# Patient Record
Sex: Male | Born: 1955 | Race: Black or African American | Hispanic: No
Health system: Southern US, Community
[De-identification: ages and names within clinical notes are randomized; demographics above are authoritative.]

## PROBLEM LIST (undated history)

## (undated) DIAGNOSIS — G4730 Sleep apnea, unspecified: Secondary | ICD-10-CM

## (undated) DIAGNOSIS — I509 Heart failure, unspecified: Secondary | ICD-10-CM

## (undated) DIAGNOSIS — E119 Type 2 diabetes mellitus without complications: Secondary | ICD-10-CM

## (undated) DIAGNOSIS — N289 Disorder of kidney and ureter, unspecified: Secondary | ICD-10-CM

## (undated) DIAGNOSIS — J9612 Chronic respiratory failure with hypercapnia: Secondary | ICD-10-CM

## (undated) DIAGNOSIS — I10 Essential (primary) hypertension: Secondary | ICD-10-CM

## (undated) HISTORY — PX: TRACHEOSTOMY: SUR1362

---

## 2015-04-29 ENCOUNTER — Inpatient Hospital Stay (HOSPITAL_COMMUNITY)
Admission: EM | Admit: 2015-04-29 | Discharge: 2015-05-03 | DRG: 291 | Disposition: A | Discharge status: Other Acute Inpatient Hospital | Payer: Medicare Other | Attending: Internal Medicine | Admitting: Internal Medicine

## 2015-04-29 ENCOUNTER — Emergency Department (HOSPITAL_COMMUNITY): Payer: Medicare Other

## 2015-04-29 ENCOUNTER — Encounter (HOSPITAL_COMMUNITY): Payer: Self-pay | Admitting: Emergency Medicine

## 2015-04-29 ENCOUNTER — Inpatient Hospital Stay (HOSPITAL_COMMUNITY): Payer: Medicare Other

## 2015-04-29 DIAGNOSIS — Z7951 Long term (current) use of inhaled steroids: Secondary | ICD-10-CM

## 2015-04-29 DIAGNOSIS — D638 Anemia in other chronic diseases classified elsewhere: Secondary | ICD-10-CM | POA: Diagnosis present

## 2015-04-29 DIAGNOSIS — J9622 Acute and chronic respiratory failure with hypercapnia: Secondary | ICD-10-CM | POA: Diagnosis present

## 2015-04-29 DIAGNOSIS — R4182 Altered mental status, unspecified: Secondary | ICD-10-CM

## 2015-04-29 DIAGNOSIS — Z09 Encounter for follow-up examination after completed treatment for conditions other than malignant neoplasm: Secondary | ICD-10-CM

## 2015-04-29 DIAGNOSIS — I5033 Acute on chronic diastolic (congestive) heart failure: Secondary | ICD-10-CM | POA: Diagnosis present

## 2015-04-29 DIAGNOSIS — Z794 Long term (current) use of insulin: Secondary | ICD-10-CM | POA: Diagnosis not present

## 2015-04-29 DIAGNOSIS — R109 Unspecified abdominal pain: Secondary | ICD-10-CM | POA: Insufficient documentation

## 2015-04-29 DIAGNOSIS — E8770 Fluid overload, unspecified: Secondary | ICD-10-CM | POA: Diagnosis not present

## 2015-04-29 DIAGNOSIS — Z6841 Body Mass Index (BMI) 40.0 and over, adult: Secondary | ICD-10-CM

## 2015-04-29 DIAGNOSIS — D696 Thrombocytopenia, unspecified: Secondary | ICD-10-CM | POA: Diagnosis not present

## 2015-04-29 DIAGNOSIS — J9621 Acute and chronic respiratory failure with hypoxia: Secondary | ICD-10-CM | POA: Diagnosis present

## 2015-04-29 DIAGNOSIS — E872 Acidosis: Secondary | ICD-10-CM | POA: Diagnosis present

## 2015-04-29 DIAGNOSIS — Z930 Tracheostomy status: Secondary | ICD-10-CM | POA: Diagnosis not present

## 2015-04-29 DIAGNOSIS — Z885 Allergy status to narcotic agent status: Secondary | ICD-10-CM | POA: Diagnosis not present

## 2015-04-29 DIAGNOSIS — I1310 Hypertensive heart and chronic kidney disease without heart failure, with stage 1 through stage 4 chronic kidney disease, or unspecified chronic kidney disease: Secondary | ICD-10-CM | POA: Diagnosis present

## 2015-04-29 DIAGNOSIS — E1122 Type 2 diabetes mellitus with diabetic chronic kidney disease: Secondary | ICD-10-CM | POA: Diagnosis present

## 2015-04-29 DIAGNOSIS — E875 Hyperkalemia: Secondary | ICD-10-CM | POA: Diagnosis present

## 2015-04-29 DIAGNOSIS — Z79899 Other long term (current) drug therapy: Secondary | ICD-10-CM | POA: Diagnosis not present

## 2015-04-29 DIAGNOSIS — N183 Chronic kidney disease, stage 3 (moderate): Secondary | ICD-10-CM | POA: Diagnosis present

## 2015-04-29 DIAGNOSIS — G9340 Encephalopathy, unspecified: Secondary | ICD-10-CM | POA: Diagnosis present

## 2015-04-29 DIAGNOSIS — J189 Pneumonia, unspecified organism: Secondary | ICD-10-CM | POA: Insufficient documentation

## 2015-04-29 DIAGNOSIS — Z881 Allergy status to other antibiotic agents status: Secondary | ICD-10-CM | POA: Diagnosis not present

## 2015-04-29 DIAGNOSIS — Z7982 Long term (current) use of aspirin: Secondary | ICD-10-CM | POA: Diagnosis not present

## 2015-04-29 DIAGNOSIS — N179 Acute kidney failure, unspecified: Secondary | ICD-10-CM | POA: Diagnosis not present

## 2015-04-29 DIAGNOSIS — J810 Acute pulmonary edema: Secondary | ICD-10-CM | POA: Diagnosis present

## 2015-04-29 DIAGNOSIS — I509 Heart failure, unspecified: Secondary | ICD-10-CM | POA: Diagnosis not present

## 2015-04-29 DIAGNOSIS — J9620 Acute and chronic respiratory failure, unspecified whether with hypoxia or hypercapnia: Secondary | ICD-10-CM

## 2015-04-29 HISTORY — DX: Essential (primary) hypertension: I10

## 2015-04-29 HISTORY — DX: Heart failure, unspecified: I50.9

## 2015-04-29 HISTORY — DX: Sleep apnea, unspecified: G47.30

## 2015-04-29 HISTORY — DX: Disorder of kidney and ureter, unspecified: N28.9

## 2015-04-29 HISTORY — DX: Type 2 diabetes mellitus without complications: E11.9

## 2015-04-29 LAB — CBC WITH DIFFERENTIAL/PLATELET
BASOS ABS: 0 10*3/uL (ref 0.0–0.1)
BASOS PCT: 0 %
EOS ABS: 0.1 10*3/uL (ref 0.0–0.7)
Eosinophils Relative: 1 %
HCT: 34.9 % — ABNORMAL LOW (ref 39.0–52.0)
HEMOGLOBIN: 10.7 g/dL — AB (ref 13.0–17.0)
LYMPHS PCT: 27 %
Lymphs Abs: 2.4 10*3/uL (ref 0.7–4.0)
MCH: 26.8 pg (ref 26.0–34.0)
MCHC: 30.7 g/dL (ref 30.0–36.0)
MCV: 87.3 fL (ref 78.0–100.0)
MONO ABS: 0.5 10*3/uL (ref 0.1–1.0)
Monocytes Relative: 6 %
NEUTROS PCT: 66 %
Neutro Abs: 5.9 10*3/uL (ref 1.7–7.7)
PLATELETS: 115 10*3/uL — AB (ref 150–400)
RBC: 4 MIL/uL — AB (ref 4.22–5.81)
RDW: 22 % — ABNORMAL HIGH (ref 11.5–15.5)
WBC: 8.9 10*3/uL (ref 4.0–10.5)

## 2015-04-29 LAB — COMPREHENSIVE METABOLIC PANEL
ALBUMIN: 2 g/dL — AB (ref 3.5–5.0)
ALT: 30 U/L (ref 17–63)
ANION GAP: 8 (ref 5–15)
AST: 33 U/L (ref 15–41)
Alkaline Phosphatase: 107 U/L (ref 38–126)
BUN: 56 mg/dL — AB (ref 6–20)
CHLORIDE: 100 mmol/L — AB (ref 101–111)
CO2: 33 mmol/L — ABNORMAL HIGH (ref 22–32)
Calcium: 8.6 mg/dL — ABNORMAL LOW (ref 8.9–10.3)
Creatinine, Ser: 2.01 mg/dL — ABNORMAL HIGH (ref 0.61–1.24)
GFR calc Af Amer: 40 mL/min — ABNORMAL LOW (ref >60)
GFR, EST NON AFRICAN AMERICAN: 35 mL/min — AB (ref >60)
GLUCOSE: 91 mg/dL (ref 65–99)
POTASSIUM: 5.2 mmol/L — AB (ref 3.5–5.1)
Sodium: 141 mmol/L (ref 135–145)
TOTAL PROTEIN: 8.2 g/dL — AB (ref 6.5–8.1)
Total Bilirubin: 1.5 mg/dL — ABNORMAL HIGH (ref 0.3–1.2)

## 2015-04-29 LAB — I-STAT ARTERIAL BLOOD GAS, ED
Acid-Base Excess: 5 mmol/L — ABNORMAL HIGH (ref 0.0–2.0)
Bicarbonate: 33.7 mEq/L — ABNORMAL HIGH (ref 20.0–24.0)
O2 Saturation: 78 %
PCO2 ART: 67.9 mmHg — AB (ref 35.0–45.0)
PH ART: 7.303 — AB (ref 7.350–7.450)
TCO2: 36 mmol/L (ref 0–100)
pO2, Arterial: 48 mmHg — ABNORMAL LOW (ref 80.0–100.0)

## 2015-04-29 LAB — I-STAT TROPONIN, ED: TROPONIN I, POC: 0 ng/mL (ref 0.00–0.08)

## 2015-04-29 MED ORDER — BUMETANIDE 0.25 MG/ML IJ SOLN: 2.0000 mg | Freq: Once | INTRAMUSCULAR | Status: DC

## 2015-04-29 MED ORDER — BUDESONIDE 0.5 MG/2ML IN SUSP
1.0000 mg | Freq: Two times a day (BID) | RESPIRATORY_TRACT | Status: DC
  Administered 2015-04-30 – 2015-05-03 (×7): 1 mg via RESPIRATORY_TRACT
  Filled 2015-04-29 (×8): qty 4

## 2015-04-29 MED ORDER — FAMOTIDINE 20 MG PO TABS: 20.0000 mg | ORAL_TABLET | Freq: Every day | ORAL | Status: DC

## 2015-04-29 MED ORDER — CARVEDILOL 25 MG PO TABS
25.0000 mg | ORAL_TABLET | Freq: Two times a day (BID) | ORAL | Status: DC
  Administered 2015-04-30 – 2015-05-03 (×8): 25 mg via ORAL
  Filled 2015-04-29 (×5): qty 1
  Filled 2015-04-29: qty 2
  Filled 2015-04-29 (×2): qty 1

## 2015-04-29 MED ORDER — HEPARIN SODIUM (PORCINE) 5000 UNIT/ML IJ SOLN
5000.0000 [IU] | Freq: Three times a day (TID) | INTRAMUSCULAR | Status: DC
  Administered 2015-04-30 – 2015-05-02 (×8): 5000 [IU] via SUBCUTANEOUS
  Filled 2015-04-29 (×7): qty 1

## 2015-04-29 MED ORDER — FEBUXOSTAT 40 MG PO TABS
40.0000 mg | ORAL_TABLET | Freq: Every day | ORAL | Status: DC
  Administered 2015-04-30 – 2015-05-03 (×4): 40 mg via ORAL
  Filled 2015-04-29 (×4): qty 1

## 2015-04-29 MED ORDER — FAMOTIDINE IN NACL 20-0.9 MG/50ML-% IV SOLN
20.0000 mg | Freq: Two times a day (BID) | INTRAVENOUS | Status: DC
  Administered 2015-04-30 (×3): 20 mg via INTRAVENOUS
  Filled 2015-04-29 (×4): qty 50

## 2015-04-29 MED ORDER — BISACODYL 10 MG RE SUPP: 10.0000 mg | RECTAL | Status: DC | PRN

## 2015-04-29 MED ORDER — ACETAMINOPHEN 325 MG PO TABS
325.0000 mg | ORAL_TABLET | Freq: Four times a day (QID) | ORAL | Status: DC | PRN
  Administered 2015-05-01 (×2): 325 mg via ORAL
  Filled 2015-04-29 (×2): qty 1

## 2015-04-29 MED ORDER — SODIUM CHLORIDE 0.9 % IV SOLN: 250.0000 mL | INTRAVENOUS | Status: DC | PRN

## 2015-04-29 MED ORDER — IPRATROPIUM-ALBUTEROL 0.5-2.5 (3) MG/3ML IN SOLN
3.0000 mL | Freq: Four times a day (QID) | RESPIRATORY_TRACT | Status: DC
  Administered 2015-04-30 – 2015-05-03 (×15): 3 mL via RESPIRATORY_TRACT
  Filled 2015-04-29 (×16): qty 3

## 2015-04-29 MED ORDER — DRONEDARONE HCL 400 MG PO TABS
400.0000 mg | ORAL_TABLET | Freq: Two times a day (BID) | ORAL | Status: DC
  Administered 2015-04-30 – 2015-05-03 (×8): 400 mg via ORAL
  Filled 2015-04-29 (×11): qty 1

## 2015-04-29 MED ORDER — BUMETANIDE 0.25 MG/ML IJ SOLN
2.0000 mg | Freq: Once | INTRAMUSCULAR | Status: AC
Start: 2015-04-30 — End: 2015-04-30
  Administered 2015-04-30: 2 mg via INTRAVENOUS
  Filled 2015-04-29: qty 8

## 2015-04-29 MED ORDER — CHLORHEXIDINE GLUCONATE 0.12 % MT SOLN
15.0000 mL | Freq: Two times a day (BID) | OROMUCOSAL | Status: DC
  Administered 2015-04-30 – 2015-05-03 (×8): 15 mL via OROMUCOSAL
  Filled 2015-04-29 (×7): qty 15

## 2015-04-29 NOTE — H&P (Signed)
PULMONARY / CRITICAL CARE MEDICINE   Name: Daniel Maynard MRN: [id number] DOB: May 26, 1955    ADMISSION DATE:  04/29/2015  CHIEF COMPLAINT:  Acute encephalopathy   HISTORY OF PRESENT ILLNESS:    59 yoAAM with h/o, DM, chronic HFpEF, CKD (baseline Scr 1.7 - 2.0), chronic respiratory failure with tracheostomy (night time ventilator use and daytime trach collar).  He is currently encephalopathic and a poor historian, his HPI is limited to EMS and ED reports.  No answer at [facility] (where he currently resides).  Per report over the last two days the patient has become progressively encephalopathic after the downsize of his tracheostomy from an 8 to a 7XLT Shiley.  He has also refused his diuretic and reportedly refused ABGs.  Reportedly he has required increased suction due to increased secretions.  The patient currently denies any symptoms.  PAST MEDICAL HISTORY :   has a past medical history of Hypertension; CHF (congestive heart failure) (HCC); Renal disorder; Diabetes mellitus without complication (HCC); and Sleep apnea.  has past surgical history that includes Tracheostomy. Prior to Admission medications   Medication Sig Start Date End Date Taking? Authorizing Provider  acetaminophen (TYLENOL) 325 MG tablet Take 325 mg by mouth every 6 (six) hours as needed for mild pain or moderate pain.    Yes Historical Provider, MD  amLODipine (NORVASC) 10 MG tablet Take 10 mg by mouth daily.   Yes Historical Provider, MD  aspirin EC 81 MG tablet Take 81 mg by mouth daily.   Yes Historical Provider, MD  bisacodyl (DULCOLAX) 10 MG suppository Place 10 mg rectally as needed for mild constipation or moderate constipation.   Yes Historical Provider, MD  budesonide (PULMICORT) 1 MG/2ML nebulizer solution Take 1 mg by nebulization 2 (two) times daily.   Yes Historical Provider, MD  carvedilol (COREG) 25 MG tablet Take 25 mg by mouth 2 (two) times daily. 03/21/15  Yes Historical Provider, MD   chlorhexidine (PERIDEX) 0.12 % solution Use as directed 15 mLs in the mouth or throat 2 (two) times daily.   Yes Historical Provider, MD  colchicine 0.6 MG tablet Take 0.6 mg by mouth 2 (two) times daily.   Yes Historical Provider, MD  diclofenac sodium (VOLTAREN) 1 % GEL Apply 1 application topically 4 (four) times daily.   Yes Historical Provider, MD  dronedarone (MULTAQ) 400 MG tablet Take 400 mg by mouth 2 (two) times daily with a meal.   Yes Historical Provider, MD  famotidine (PEPCID) 20 MG tablet Take 20 mg by mouth at bedtime. 03/21/15  Yes Historical Provider, MD  febuxostat (ULORIC) 40 MG tablet Take 40 mg by mouth daily.   Yes Historical Provider, MD  furosemide (LASIX) 40 MG tablet Inject 40 mg into the vein once.   Yes Historical Provider, MD  gabapentin (NEURONTIN) 100 MG capsule Take 200 mg by mouth 2 (two) times daily.   Yes Historical Provider, MD  insulin aspart (NOVOLOG) 100 UNIT/ML injection Inject 3-20 Units into the skin 3 (three) times daily with meals as needed for high blood sugar (151-200 = 3 units, 201-300 = 5 units, 301-400 = 10 units, 401-500 = 15 units, 501+ = 20 units).    Yes Historical Provider, MD  ipratropium-albuterol (DUONEB) 0.5-2.5 (3) MG/3ML SOLN Take 3 mLs by nebulization every 6 (six) hours.   Yes Historical Provider, MD  torsemide (DEMADEX) 10 MG tablet Take 10 mg by mouth daily.   Yes Historical Provider, MD  traMADol (ULTRAM) 50 MG tablet Take 50  mg by mouth every 6 (six) hours as needed for moderate pain or severe pain.   Yes Historical Provider, MD   Allergies  Allergen Reactions  . Hydrocodone   . Morphine And Related   . Zosyn [Piperacillin Sod-Tazobactam So]     FAMILY HISTORY:  has no family status information on file.  SOCIAL HISTORY:    REVIEW OF SYSTEMS:  Unable to obtain due to encephalopathy.  SUBJECTIVE:   VITAL SIGNS: Temp:  [98.9 F (37.2 C)] 98.9 F (37.2 C) (12/23 1928) Pulse Rate:  [77-89] 84 (12/23 2212) Resp:  [22-26]  24 (12/23 2045) BP: (125-154)/(87-95) 125/87 mmHg (12/23 2045) SpO2:  [91 %-98 %] 96 % (12/23 2212) HEMODYNAMICS:   VENTILATOR SETTINGS:   INTAKE / OUTPUT: No intake or output data in the 24 hours ending 04/29/15 2251  PHYSICAL EXAMINATION: General:  NAD, somnolent Neuro:  CN II-XII intact, no focal deficits. Pupils 3mm and reactive to light. (unable to perform asterixis) HEENT:  Tracheostomy in place, site is c/d/i.  PERLA, EOMI Cardiovascular:  RRR, s1/s2, 3/6 holosystolic murmur LSB 4rth intercostal space, no radiation Lungs:  Diffusely decreased breath sounds.  No wheezing or rhonchi, mild rales LLL. Abdomen:  Soft, normal bowel sounds non tender Musculoskeletal:  Moves all extremities equally Extremities: 1-2 + pitting edema b/l LE  LABS:  CBC  Recent Labs Lab 04/29/15 2004  WBC 8.9  HGB 10.7*  HCT 34.9*  PLT 115*   Coag's No results for input(s): APTT, INR in the last 168 hours. BMET  Recent Labs Lab 04/29/15 1944  NA 141  K 5.2*  CL 100*  CO2 33*  BUN 56*  CREATININE 2.01*  GLUCOSE 91   Electrolytes  Recent Labs Lab 04/29/15 1944  CALCIUM 8.6*   Sepsis Markers No results for input(s): LATICACIDVEN, PROCALCITON, O2SATVEN in the last 168 hours. ABG  Recent Labs Lab 04/29/15 2109  PHART 7.303*  PCO2ART 67.9*  PO2ART 48.0*   Liver Enzymes  Recent Labs Lab 04/29/15 1944  AST 33  ALT 30  ALKPHOS 107  BILITOT 1.5*  ALBUMIN 2.0*   Cardiac Enzymes No results for input(s): TROPONINI, PROBNP in the last 168 hours. Glucose No results for input(s): GLUCAP in the last 168 hours.  Imaging Dg Chest Portable 1 View  04/29/2015  CLINICAL DATA:  59 year old male with hypoxia EXAM: PORTABLE CHEST 1 VIEW COMPARISON:  None. FINDINGS: Single-view of the chest demonstrates cardiomegaly with increased central vascular prominence compatible with congestive changes. There are mild diffuse bilateral nodular airspace densities. Bibasilar dependent  atelectatic changes noted. There is no focal consolidation or pneumothorax. Trace right pleural effusion may be present. Left pectoral pacemaker device. The osseous structures are grossly unremarkable. Tracheostomy above the carina. IMPRESSION: Cardiomegaly with congestive changes. Superimposed pneumonia is not excluded. Electronically Signed   By: Elgie CollardArash  Radparvar M.D.   On: 04/29/2015 20:00     ASSESSMENT / PLAN: 59yo male with acute encephalopathy likely secondary to hypercapnic respiratory failure that is multifactorial and inculdes, pulmonary edema from decompensated CHF as well as down size trach and vent non adherence.  PULMONARY A:Acute on chronic hypercapnic respiratory failure and pulmonary edema and a respiratory acidosis. P:    - diuresis goal net neg 1-2L by AM  - place on Ventilator tonight with Pressure support Goal TV 500 and rate 15-20  - will change to AC/VC if the above MV is not achieved  - repeat ABG in AM  CARDIOVASCULAR A: Decompensated HFpEF (EF 55-60 on TTE  at [facility] in 03/2015) with hypervolemia and resulting pulmonary edema.   P:   - no chest pain and negative trop make ACS unlikely  - Diuresis with bumex  (home dose appears to be )  - Goal net neg 1-2L by AM  - check BNP  - complete TTE in AM  - Dry weight unknown  RENAL A:  CKD.  Per chart review there have been reports that his BL Scr is 0.7.  However, in CareEverywhere his creatinine has been 1.8-2.0 at [facility] last month and even as far back as 2014 he had levels as high as 2.  Per chart review he carries a diagnosis of cardiorenal syndrome.  Respiratory acidosis with partial compensation P:    - Check urine studies  - no indications for HD at this time  - bumex as above  - foley inplace  HEMATOLOGIC A:  Chronic stable anemia.  Likely 2/2 chronic disease vs renal insufficiency.  Current Hb is improved since last month at [facility]. P:   - Iron studies, reticulocyte count and epo  level  INFECTIOUS A:  Given his hypercapnea and lack of other septic physiology I have a low suspicion for sepsis or neurologic infection.  Will evaluate but hold Abx at this time. P:   BCx2 04/29/2015   ENDOCRINE A:  DM   P:    - SSI  NEUROLOGIC A:  Acute encephalopathy, likely 2/2 hypercapnia.  Neurologic exam is non-focal. P:    - hold tramadol  - hold gabapentin  - treatment of hypercapnea as above  - CT brain today   -NPO until AM -Heparin sub cutaneously -Full code/full tube  Total critical care time: 30 min  Critical care time was exclusive of separately billable procedures and treating other patients.  Critical care was necessary to treat or prevent imminent or life-threatening deterioration.  Critical care was time spent personally by me on the following activities: development of treatment plan with patient and/or surrogate as well as nursing, discussions with consultants, evaluation of patient's response to treatment, examination of patient, obtaining history from patient or surrogate, ordering and performing treatments and interventions, ordering and review of laboratory studies, ordering and review of radiographic studies, pulse oximetry and re-evaluation of patient's condition.   Galvin Proffer, DO., MS [facility]      Pulmonary and Critical Care Medicine [facility] Pager: [phone]  04/29/2015, 10:51 PM

## 2015-04-29 NOTE — ED Notes (Signed)
Critical care MD at bedside 

## 2015-04-29 NOTE — Progress Notes (Signed)
ABG shown to MD. Will continue to monitor. 

## 2015-04-29 NOTE — ED Notes (Signed)
Daniel HummerCharlene (POA) - [phone]

## 2015-04-29 NOTE — ED Notes (Signed)
Per EMS, [facility] EMS states that the pt's nurse at [facility] states that the pt has been altered since Wednesday after his trach was replaced with the wrong size. The nurse states that the pt should have an 8, but instead has a 7. Pt has been refusing lasix and ABGs at [facility]. Pt recently had a UTI, and has dark urine. EMS states that the pt has been answering questions appropriately.

## 2015-04-29 NOTE — ED Provider Notes (Signed)
CSN: [id number]     Arrival date & time 04/29/15  1912 History   First MD Initiated Contact with Patient 04/29/15 1927     Chief Complaint  Patient presents with  . Tracheostomy Tube Change  . Altered Mental Status   59 yo M w/PMH of CKD3, DM, HFpEF, chronic respiratory failure with tracheostomy (night time ventilator use and daytime trach collar) who presents today by EMS for concern for AMS.Pt just had his trach downsized from an 8 to a 7 on Wednesday and since that time he has been altered. Unclear what this means, as I cannot reach anyone from the facility. The pt had apparently been refusing all lasix and blood tests at the SNF. Pt unable to contribute to history as he cannot speak. But he denies any pain or SOB.   Patient is a 59 y.o. male presenting with altered mental status.  Altered Mental Status Severity:  Moderate Most recent episode:  Today Episode history:  Continuous Duration:  3 days Timing:  Constant Progression:  Unchanged Chronicity:  New Context: nursing home resident   Context: not head injury and not a recent change in medication   Associated symptoms: no abdominal pain, no fever, no headaches, no light-headedness, no nausea, no palpitations and no vomiting     Past Medical History  Diagnosis Date  . Hypertension   . CHF (congestive heart failure) (HCC)   . Renal disorder   . Diabetes mellitus without complication (HCC)   . Sleep apnea    Past Surgical History  Procedure Laterality Date  . Tracheostomy     History reviewed. No pertinent family history. Social History  Substance Use Topics  . Smoking status: Unknown If Ever Smoked  . Smokeless tobacco: None  . Alcohol Use: None    Review of Systems  Constitutional: Negative for fever and chills.  Respiratory: Negative for shortness of breath.   Cardiovascular: Negative for chest pain, palpitations and leg swelling.  Gastrointestinal: Negative for nausea, vomiting, abdominal pain, diarrhea,  constipation and abdominal distention.  Genitourinary: Negative for dysuria, frequency, flank pain and decreased urine volume.  Skin: Negative for wound.  Neurological: Negative for dizziness, speech difficulty, light-headedness and headaches.  All other systems reviewed and are negative.     Allergies  Hydrocodone; Morphine and related; and Zosyn  Home Medications   Prior to Admission medications   Medication Sig Start Date End Date Taking? Authorizing Provider  acetaminophen (TYLENOL) 325 MG tablet Take 325 mg by mouth every 6 (six) hours as needed for mild pain or moderate pain.    Yes Historical Provider, MD  amLODipine (NORVASC) 10 MG tablet Take 10 mg by mouth daily.   Yes Historical Provider, MD  aspirin EC 81 MG tablet Take 81 mg by mouth daily.   Yes Historical Provider, MD  bisacodyl (DULCOLAX) 10 MG suppository Place 10 mg rectally as needed for mild constipation or moderate constipation.   Yes Historical Provider, MD  budesonide (PULMICORT) 1 MG/2ML nebulizer solution Take 1 mg by nebulization 2 (two) times daily.   Yes Historical Provider, MD  carvedilol (COREG) 25 MG tablet Take 25 mg by mouth 2 (two) times daily. 03/21/15  Yes Historical Provider, MD  chlorhexidine (PERIDEX) 0.12 % solution Use as directed 15 mLs in the mouth or throat 2 (two) times daily.   Yes Historical Provider, MD  colchicine 0.6 MG tablet Take 0.6 mg by mouth 2 (two) times daily.   Yes Historical Provider, MD  diclofenac sodium (VOLTAREN)  1 % GEL Apply 1 application topically 4 (four) times daily.   Yes Historical Provider, MD  dronedarone (MULTAQ) 400 MG tablet Take 400 mg by mouth 2 (two) times daily with a meal.   Yes Historical Provider, MD  famotidine (PEPCID) 20 MG tablet Take 20 mg by mouth at bedtime. 03/21/15  Yes Historical Provider, MD  febuxostat (ULORIC) 40 MG tablet Take 40 mg by mouth daily.   Yes Historical Provider, MD  furosemide (LASIX) 40 MG tablet Inject 40 mg into the vein once.    Yes Historical Provider, MD  gabapentin (NEURONTIN) 100 MG capsule Take 200 mg by mouth 2 (two) times daily.   Yes Historical Provider, MD  insulin aspart (NOVOLOG) 100 UNIT/ML injection Inject 3-20 Units into the skin 3 (three) times daily with meals as needed for high blood sugar (151-200 = 3 units, 201-300 = 5 units, 301-400 = 10 units, 401-500 = 15 units, 501+ = 20 units).    Yes Historical Provider, MD  ipratropium-albuterol (DUONEB) 0.5-2.5 (3) MG/3ML SOLN Take 3 mLs by nebulization every 6 (six) hours.   Yes Historical Provider, MD  torsemide (DEMADEX) 10 MG tablet Take 10 mg by mouth daily.   Yes Historical Provider, MD  traMADol (ULTRAM) 50 MG tablet Take 50 mg by mouth every 6 (six) hours as needed for moderate pain or severe pain.   Yes Historical Provider, MD   BP 141/83 mmHg  Pulse 89  Temp(Src) 98.5 F (36.9 C) (Oral)  Resp 8  Ht  (1.676 m)  Wt 155.1 kg  BMI 55.22 kg/m2  SpO2 100% Physical Exam  Constitutional: He is oriented to person, place, and time. He appears well-developed and well-nourished. No distress.  HENT:  Head: Normocephalic and atraumatic.  Eyes: Pupils are equal, round, and reactive to light.  Neck: Normal range of motion.  Trach: Cuffed Shiley 7.0 in place.  Cardiovascular: Normal rate, regular rhythm, normal heart sounds and intact distal pulses.  Exam reveals no gallop and no friction rub.   No murmur heard. Pulmonary/Chest: Effort normal and breath sounds normal. No respiratory distress. He has no wheezes. He has no rales. He exhibits no tenderness.  Abdominal: Soft. Bowel sounds are normal. He exhibits no distension and no mass. There is no tenderness. There is no rebound and no guarding.  Musculoskeletal: Normal range of motion.  Lymphadenopathy:    He has no cervical adenopathy.  Neurological: He is alert and oriented to person, place, and time. No cranial nerve deficit. Coordination normal.  Skin: Skin is warm and dry. No rash noted. He is  not diaphoretic.  Nursing note and vitals reviewed.   ED Course  Procedures (including critical care time) Angiocath insertion Performed by: Rachelle Hora  Consent: Verbal consent obtained. Risks and benefits: risks, benefits and alternatives were discussed Time out: Immediately prior to procedure a "time out" was called to verify the correct patient, procedure, equipment, support staff and site/side marked as required.  Preparation: Patient was prepped and draped in the usual sterile fashion.  Vein Location: left AC fossa  Ultrasound Guided  Gauge: 18  Normal blood return and flush without difficulty Patient tolerance: Patient tolerated the procedure well with no immediate complications.    Labs Review Labs Reviewed  COMPREHENSIVE METABOLIC PANEL - Abnormal; Notable for the following:    Potassium 5.2 (*)    Chloride 100 (*)    CO2 33 (*)    BUN 56 (*)    Creatinine, Ser 2.01 (*)  Calcium 8.6 (*)    Total Protein 8.2 (*)    Albumin 2.0 (*)    Total Bilirubin 1.5 (*)    GFR calc non Af Amer 35 (*)    GFR calc Af Amer 40 (*)    All other components within normal limits  CBC WITH DIFFERENTIAL/PLATELET - Abnormal; Notable for the following:    RBC 4.00 (*)    Hemoglobin 10.7 (*)    HCT 34.9 (*)    RDW 22.0 (*)    Platelets 115 (*)    All other components within normal limits  I-STAT ARTERIAL BLOOD GAS, ED - Abnormal; Notable for the following:    pH, Arterial 7.303 (*)    pCO2 arterial 67.9 (*)    pO2, Arterial 48.0 (*)    Bicarbonate 33.7 (*)    Acid-Base Excess 5.0 (*)    All other components within normal limits  MRSA PCR SCREENING  CULTURE, BLOOD (ROUTINE X 2)  CULTURE, BLOOD (ROUTINE X 2)  CBC WITH DIFFERENTIAL/PLATELET  BLOOD GAS, ARTERIAL  BRAIN NATRIURETIC PEPTIDE  MAGNESIUM  PHOSPHORUS  LIPASE, BLOOD  CBC  BASIC METABOLIC PANEL  BLOOD GAS, ARTERIAL  URINE RAPID DRUG SCREEN, HOSP PERFORMED  SODIUM, URINE, RANDOM  CREATININE, URINE, RANDOM   UREA NITROGEN, URINE  PROTEIN, URINE, RANDOM  I-STAT TROPOININ, ED    Imaging Review Ct Head Wo Contrast  04/29/2015  CLINICAL DATA:  59 year old male with altered mental status EXAM: CT HEAD WITHOUT CONTRAST TECHNIQUE: Contiguous axial images were obtained from the base of the skull through the vertex without intravenous contrast. COMPARISON:  None. FINDINGS: The ventricles and sulci are appropriate in size for patient's age. Mild periventricular and deep white matter hypodensities represent chronic microvascular ischemic changes. There is no intracranial hemorrhage. No mass effect or midline shift identified. The visualized paranasal sinuses and mastoid air cells are well aerated. The calvarium is intact. IMPRESSION: No acute intracranial hemorrhage. Mild chronic microvascular ischemic disease. If symptoms persist and there are no contraindications, MRI may provide better evaluation if clinically indicated Electronically Signed   By: Elgie Collard M.D.   On: 04/29/2015 23:51   Dg Chest Portable 1 View  04/29/2015  CLINICAL DATA:  59 year old male with hypoxia EXAM: PORTABLE CHEST 1 VIEW COMPARISON:  None. FINDINGS: Single-view of the chest demonstrates cardiomegaly with increased central vascular prominence compatible with congestive changes. There are mild diffuse bilateral nodular airspace densities. Bibasilar dependent atelectatic changes noted. There is no focal consolidation or pneumothorax. Trace right pleural effusion may be present. Left pectoral pacemaker device. The osseous structures are grossly unremarkable. Tracheostomy above the carina. IMPRESSION: Cardiomegaly with congestive changes. Superimposed pneumonia is not excluded. Electronically Signed   By: Elgie Collard M.D.   On: 04/29/2015 20:00   I have personally reviewed and evaluated these images and lab results as part of my medical decision-making.   EKG Interpretation None      MDM   Final diagnoses:  Altered  mental status, unspecified altered mental status type   60 year old Caucasian male here with possible AMS in setting of recent trach change. He does not appear to be in any respiratory distress. Good lung sounds bilaterally. Initial ABG shows the patient is a chronic retainer with metabolic compensation.  RT suctioned large amts of secretions while in ED.   No sign of pulmonary infection. He remains in no distress. Will admit to ICU for continued monitoring for altered mental status and appropriate settings.  Pt was seen under the supervision  of Dr. Madilyn Hookees.     Rachelle HoraKeri Willim Turnage, MD 04/30/15 [id number]  Tilden FossaElizabeth Rees, MD 05/01/15 [phone]

## 2015-04-30 ENCOUNTER — Inpatient Hospital Stay (HOSPITAL_COMMUNITY): Payer: Medicare Other

## 2015-04-30 DIAGNOSIS — E875 Hyperkalemia: Secondary | ICD-10-CM

## 2015-04-30 DIAGNOSIS — I509 Heart failure, unspecified: Secondary | ICD-10-CM

## 2015-04-30 DIAGNOSIS — J9621 Acute and chronic respiratory failure with hypoxia: Secondary | ICD-10-CM

## 2015-04-30 DIAGNOSIS — G9340 Encephalopathy, unspecified: Secondary | ICD-10-CM

## 2015-04-30 DIAGNOSIS — N179 Acute kidney failure, unspecified: Secondary | ICD-10-CM

## 2015-04-30 LAB — PHOSPHORUS: PHOSPHORUS: 3.9 mg/dL (ref 2.5–4.6)

## 2015-04-30 LAB — POCT I-STAT 3, ART BLOOD GAS (G3+)
ACID-BASE EXCESS: 10 mmol/L — AB (ref 0.0–2.0)
BICARBONATE: 33.7 meq/L — AB (ref 20.0–24.0)
O2 Saturation: 95 %
PO2 ART: 68 mmHg — AB (ref 80.0–100.0)
Patient temperature: 98.7
TCO2: 35 mmol/L (ref 0–100)
pCO2 arterial: 40.1 mmHg (ref 35.0–45.0)
pH, Arterial: 7.532 — ABNORMAL HIGH (ref 7.350–7.450)

## 2015-04-30 LAB — CBC
HCT: 37 % — ABNORMAL LOW (ref 39.0–52.0)
Hemoglobin: 11.7 g/dL — ABNORMAL LOW (ref 13.0–17.0)
MCH: 27.2 pg (ref 26.0–34.0)
MCHC: 31.6 g/dL (ref 30.0–36.0)
MCV: 86 fL (ref 78.0–100.0)
PLATELETS: 102 10*3/uL — AB (ref 150–400)
RBC: 4.3 MIL/uL (ref 4.22–5.81)
RDW: 22 % — AB (ref 11.5–15.5)
WBC: 8.1 10*3/uL (ref 4.0–10.5)

## 2015-04-30 LAB — BASIC METABOLIC PANEL
Anion gap: 9 (ref 5–15)
BUN: 59 mg/dL — AB (ref 6–20)
CO2: 30 mmol/L (ref 22–32)
CREATININE: 2.14 mg/dL — AB (ref 0.61–1.24)
Calcium: 8.4 mg/dL — ABNORMAL LOW (ref 8.9–10.3)
Chloride: 100 mmol/L — ABNORMAL LOW (ref 101–111)
GFR calc Af Amer: 37 mL/min — ABNORMAL LOW (ref >60)
GFR, EST NON AFRICAN AMERICAN: 32 mL/min — AB (ref >60)
GLUCOSE: 84 mg/dL (ref 65–99)
Potassium: 5.5 mmol/L — ABNORMAL HIGH (ref 3.5–5.1)
SODIUM: 139 mmol/L (ref 135–145)

## 2015-04-30 LAB — RAPID URINE DRUG SCREEN, HOSP PERFORMED
AMPHETAMINES: NOT DETECTED
BENZODIAZEPINES: NOT DETECTED
Barbiturates: NOT DETECTED
Cocaine: NOT DETECTED
OPIATES: NOT DETECTED
TETRAHYDROCANNABINOL: NOT DETECTED

## 2015-04-30 LAB — GLUCOSE, CAPILLARY
GLUCOSE-CAPILLARY: 89 mg/dL (ref 65–99)
Glucose-Capillary: 86 mg/dL (ref 65–99)
Glucose-Capillary: 88 mg/dL (ref 65–99)

## 2015-04-30 LAB — PROTEIN, URINE, RANDOM: Total Protein, Urine: 60 mg/dL

## 2015-04-30 LAB — MRSA PCR SCREENING: MRSA BY PCR: NEGATIVE

## 2015-04-30 LAB — CREATININE, URINE, RANDOM: Creatinine, Urine: 92.96 mg/dL

## 2015-04-30 LAB — SODIUM, URINE, RANDOM: SODIUM UR: 70 mmol/L

## 2015-04-30 LAB — BRAIN NATRIURETIC PEPTIDE: B NATRIURETIC PEPTIDE 5: 330.7 pg/mL — AB (ref 0.0–100.0)

## 2015-04-30 LAB — MAGNESIUM: MAGNESIUM: 1.5 mg/dL — AB (ref 1.7–2.4)

## 2015-04-30 LAB — LIPASE, BLOOD: Lipase: 16 U/L (ref 11–51)

## 2015-04-30 MED ORDER — SODIUM POLYSTYRENE SULFONATE 15 GM/60ML PO SUSP
30.0000 g | Freq: Once | ORAL | Status: AC
  Administered 2015-04-30: 30 g via ORAL
  Filled 2015-04-30: qty 120

## 2015-04-30 MED ORDER — FUROSEMIDE 10 MG/ML IJ SOLN
40.0000 mg | Freq: Four times a day (QID) | INTRAMUSCULAR | Status: AC
  Administered 2015-04-30 (×3): 40 mg via INTRAVENOUS
  Filled 2015-04-30 (×3): qty 4

## 2015-04-30 MED ORDER — PERFLUTREN LIPID MICROSPHERE
1.0000 mL | INTRAVENOUS | Status: AC | PRN
  Administered 2015-04-30: 2 mL via INTRAVENOUS
  Filled 2015-04-30: qty 10

## 2015-04-30 MED ORDER — INSULIN ASPART 100 UNIT/ML ~~LOC~~ SOLN
2.0000 [IU] | Freq: Three times a day (TID) | SUBCUTANEOUS | Status: DC
  Administered 2015-05-01: 2 [IU] via SUBCUTANEOUS
  Administered 2015-05-02: 4 [IU] via SUBCUTANEOUS
  Administered 2015-05-02 – 2015-05-03 (×2): 2 [IU] via SUBCUTANEOUS

## 2015-04-30 MED ORDER — HYDRALAZINE HCL 20 MG/ML IJ SOLN
10.0000 mg | Freq: Once | INTRAMUSCULAR | Status: AC
  Administered 2015-04-30: 10 mg via INTRAVENOUS
  Filled 2015-04-30: qty 1

## 2015-04-30 NOTE — Progress Notes (Signed)
PULMONARY / CRITICAL CARE MEDICINE   Name: Daniel Maynard MRN: [id number] DOB: Apr 14, 1956    ADMISSION DATE:  04/29/2015  CHIEF COMPLAINT:  Acute encephalopathy   HISTORY OF PRESENT ILLNESS:    59 yoAAM with h/o, DM, chronic HFpEF, CKD (baseline Scr 1.7 - 2.0), chronic respiratory failure with tracheostomy (night time ventilator use and daytime trach collar).  He is currently encephalopathic and a poor historian, his HPI is limited to EMS and ED reports.  No answer at [facility] (where he currently resides).  Per report over the last two days the patient has become progressively encephalopathic after the downsize of his tracheostomy from an 8 to a 7XLT Shiley.  He has also refused his diuretic and reportedly refused ABGs.  Reportedly he has required increased suction due to increased secretions.  The patient currently denies any symptoms.  SUBJECTIVE:   VITAL SIGNS: Temp:  [98.5 F (36.9 C)-100.3 F (37.9 C)] 100.3 F (37.9 C) (12/24 0428) Pulse Rate:  [77-90] 83 (12/24 0723) Resp:  [8-26] 18 (12/24 0723) BP: (125-165)/(83-108) 160/83 mmHg (12/24 0723) SpO2:  [91 %-100 %] 97 % (12/24 0723) FiO2 (%):  [40 %-60 %] 40 % (12/24 0723) Weight:  [153.2 kg (337 lb 11.9 oz)-155.1 kg (341 lb 14.9 oz)] 153.2 kg (337 lb 11.9 oz) (12/24 0500)   HEMODYNAMICS:   VENTILATOR SETTINGS: Vent Mode:  [-] PRVC FiO2 (%):  [40 %-60 %] 40 % Set Rate:  [22 bmp] 22 bmp Vt Set:  [500 mL] 500 mL PEEP:  [5 cmH20] 5 cmH20 Plateau Pressure:  [18 cmH20-21 cmH20] 18 cmH20   INTAKE / OUTPUT:  Intake/Output Summary (Last 24 hours) at 04/30/15 0749 Last data filed at 04/30/15 0700  Gross per 24 hour  Intake    140 ml  Output   1700 ml  Net  -1560 ml   PHYSICAL EXAMINATION: General:  NAD, arousable Neuro:  CN II-XII intact, no focal deficits. Pupils 3mm and reactive to light. (unable to perform asterixis) HEENT:  Tracheostomy in place, site is c/d/i.  PERLA, EOMI Cardiovascular:  RRR, s1/s2, 3/6  holosystolic murmur LSB 4rth intercostal space, no radiation Lungs:  Diffusely decreased breath sounds.  No wheezing or rhonchi, mild rales LLL. Abdomen:  Soft, normal bowel sounds non tender Musculoskeletal:  Moves all extremities equally Extremities: 1-2 + pitting edema b/l LE  LABS:  CBC  Recent Labs Lab 04/29/15 2004 04/30/15 0105  WBC 8.9 8.1  HGB 10.7* 11.7*  HCT 34.9* 37.0*  PLT 115* 102*   Coag's No results for input(s): APTT, INR in the last 168 hours. BMET  Recent Labs Lab 04/29/15 1944 04/30/15 0105  NA 141 139  K 5.2* 5.5*  CL 100* 100*  CO2 33* 30  BUN 56* 59*  CREATININE 2.01* 2.14*  GLUCOSE 91 84   Electrolytes  Recent Labs Lab 04/29/15 1944 04/30/15 0105  CALCIUM 8.6* 8.4*  MG  --  1.5*  PHOS  --  3.9   Sepsis Markers No results for input(s): LATICACIDVEN, PROCALCITON, O2SATVEN in the last 168 hours. ABG  Recent Labs Lab 04/29/15 2109 04/30/15 0504  PHART 7.303* 7.532*  PCO2ART 67.9* 40.1  PO2ART 48.0* 68.0*   Liver Enzymes  Recent Labs Lab 04/29/15 1944  AST 33  ALT 30  ALKPHOS 107  BILITOT 1.5*  ALBUMIN 2.0*   Cardiac Enzymes No results for input(s): TROPONINI, PROBNP in the last 168 hours. Glucose No results for input(s): GLUCAP in the last 168 hours.  Imaging Ct  Head Wo Contrast  04/29/2015  CLINICAL DATA:  59 year old male with altered mental status EXAM: CT HEAD WITHOUT CONTRAST TECHNIQUE: Contiguous axial images were obtained from the base of the skull through the vertex without intravenous contrast. COMPARISON:  None. FINDINGS: The ventricles and sulci are appropriate in size for patient's age. Mild periventricular and deep white matter hypodensities represent chronic microvascular ischemic changes. There is no intracranial hemorrhage. No mass effect or midline shift identified. The visualized paranasal sinuses and mastoid air cells are well aerated. The calvarium is intact. IMPRESSION: No acute intracranial  hemorrhage. Mild chronic microvascular ischemic disease. If symptoms persist and there are no contraindications, MRI may provide better evaluation if clinically indicated Electronically Signed   By: Elgie Collard M.D.   On: 04/29/2015 23:51   Dg Chest Portable 1 View  04/29/2015  CLINICAL DATA:  59 year old male with hypoxia EXAM: PORTABLE CHEST 1 VIEW COMPARISON:  None. FINDINGS: Single-view of the chest demonstrates cardiomegaly with increased central vascular prominence compatible with congestive changes. There are mild diffuse bilateral nodular airspace densities. Bibasilar dependent atelectatic changes noted. There is no focal consolidation or pneumothorax. Trace right pleural effusion may be present. Left pectoral pacemaker device. The osseous structures are grossly unremarkable. Tracheostomy above the carina. IMPRESSION: Cardiomegaly with congestive changes. Superimposed pneumonia is not excluded. Electronically Signed   By: Elgie Collard M.D.   On: 04/29/2015 20:00   I reviewed CXR myself, trach ok.  ASSESSMENT / PLAN: 59yo male with acute encephalopathy likely secondary to hypercapnic respiratory failure that is multifactorial and inculdes, pulmonary edema from decompensated CHF as well as down size trach and vent non adherence.  PULMONARY A:Acute on chronic hypercapnic respiratory failure and pulmonary edema and a respiratory acidosis. P:    - Lasix 40 mg IV q6 hours x3 doses.  - Vent at night and ATC during the day.  - Titrate O2 for sat of 88-92%.  CARDIOVASCULAR A: Decompensated HFpEF (EF 55-60 on TTE at [facility] in 03/2015) with hypervolemia and resulting pulmonary edema.   P:   - No chest pain and negative trop make ACS unlikely.  - Lasix 40 mg IV q6 hours x3 doses.  - Check BNP.  - Complete TTE pending.  - Dry weight unknown.  RENAL A:  CKD.  Per chart review there have been reports that his BL Scr is 0.7.  However, in CareEverywhere his creatinine has been 1.8-2.0 at  [facility] health last month and even as far back as 2014 he had levels as high as 2.  Per chart review he carries a diagnosis of cardiorenal syndrome.  Respiratory acidosis with partial compensation.  Hyperkalemia. P:    - Urine studies pending.  - No indications for HD at this time.  - Diureses as ordered.  - Foley inplace by urology do not remove.  HEMATOLOGIC A:  Chronic stable anemia.  Likely 2/2 chronic disease vs renal insufficiency.  Current Hb is improved since last month at [facility]. P:   - Iron studies, reticulocyte count and epo level  INFECTIOUS A:  Given his hypercapnea and lack of other septic physiology I have a low suspicion for sepsis or neurologic infection.  Will evaluate but hold Abx at this time. P:   BCx2 04/29/2015  ENDOCRINE A:  DM   P:    - SSI AC/HS.  NEUROLOGIC A:  Acute encephalopathy, likely 2/2 hypercapnia.  Neurologic exam is non-focal. P:    - Hold tramadol  - Hold gabapentin  - CT brain  negative.   - Start heart healthy diet. - Heparin sub cutaneously - Full code/full tube  Discussed with TRH, [facility] SDU and [facility] with PCCM as consult.  Alyson ReedyWesam G. Guyla Bless, M.D. [facility] Pulmonary/Critical Care Medicine. Pager: [phone]. After hours pager: [phone].   04/30/2015, 7:49 AM

## 2015-04-30 NOTE — Progress Notes (Signed)
Placed patient on ventilator for the night with no complications. RT will continue to monitor.

## 2015-04-30 NOTE — Progress Notes (Signed)
Echocardiogram 2D Echocardiogram with Definity has been performed.  Nolon RodBrown, Tony 04/30/2015, 11:03 AM

## 2015-05-01 ENCOUNTER — Inpatient Hospital Stay (HOSPITAL_COMMUNITY): Payer: Medicare Other

## 2015-05-01 DIAGNOSIS — J9620 Acute and chronic respiratory failure, unspecified whether with hypoxia or hypercapnia: Secondary | ICD-10-CM

## 2015-05-01 DIAGNOSIS — J9622 Acute and chronic respiratory failure with hypercapnia: Secondary | ICD-10-CM

## 2015-05-01 DIAGNOSIS — J189 Pneumonia, unspecified organism: Secondary | ICD-10-CM | POA: Insufficient documentation

## 2015-05-01 DIAGNOSIS — Z930 Tracheostomy status: Secondary | ICD-10-CM

## 2015-05-01 LAB — UREA NITROGEN, URINE: Urea Nitrogen, Ur: 280 mg/dL

## 2015-05-01 LAB — CBC WITH DIFFERENTIAL/PLATELET
BASOS ABS: 0 10*3/uL (ref 0.0–0.1)
BASOS PCT: 0 %
EOS ABS: 0 10*3/uL (ref 0.0–0.7)
Eosinophils Relative: 0 %
HEMATOCRIT: 34.3 % — AB (ref 39.0–52.0)
HEMOGLOBIN: 10.8 g/dL — AB (ref 13.0–17.0)
Lymphocytes Relative: 25 %
Lymphs Abs: 2 10*3/uL (ref 0.7–4.0)
MCH: 26.5 pg (ref 26.0–34.0)
MCHC: 31.5 g/dL (ref 30.0–36.0)
MCV: 84.1 fL (ref 78.0–100.0)
Monocytes Absolute: 0.6 10*3/uL (ref 0.1–1.0)
Monocytes Relative: 8 %
NEUTROS ABS: 5.3 10*3/uL (ref 1.7–7.7)
NEUTROS PCT: 67 %
Platelets: 98 10*3/uL — ABNORMAL LOW (ref 150–400)
RBC: 4.08 MIL/uL — ABNORMAL LOW (ref 4.22–5.81)
RDW: 22.2 % — AB (ref 11.5–15.5)
WBC: 7.9 10*3/uL (ref 4.0–10.5)

## 2015-05-01 LAB — BASIC METABOLIC PANEL
ANION GAP: 8 (ref 5–15)
BUN: 47 mg/dL — AB (ref 6–20)
CHLORIDE: 98 mmol/L — AB (ref 101–111)
CO2: 34 mmol/L — ABNORMAL HIGH (ref 22–32)
Calcium: 8.3 mg/dL — ABNORMAL LOW (ref 8.9–10.3)
Creatinine, Ser: 1.42 mg/dL — ABNORMAL HIGH (ref 0.61–1.24)
GFR, EST NON AFRICAN AMERICAN: 53 mL/min — AB (ref >60)
Glucose, Bld: 138 mg/dL — ABNORMAL HIGH (ref 65–99)
POTASSIUM: 3.8 mmol/L (ref 3.5–5.1)
SODIUM: 140 mmol/L (ref 135–145)

## 2015-05-01 LAB — GLUCOSE, CAPILLARY
GLUCOSE-CAPILLARY: 116 mg/dL — AB (ref 65–99)
GLUCOSE-CAPILLARY: 118 mg/dL — AB (ref 65–99)
GLUCOSE-CAPILLARY: 123 mg/dL — AB (ref 65–99)
GLUCOSE-CAPILLARY: 93 mg/dL (ref 65–99)

## 2015-05-01 MED ORDER — FENTANYL CITRATE (PF) 100 MCG/2ML IJ SOLN: 12.5000 ug | INTRAMUSCULAR | Status: DC | PRN

## 2015-05-01 MED ORDER — FAMOTIDINE 20 MG PO TABS
20.0000 mg | ORAL_TABLET | Freq: Every day | ORAL | Status: DC
  Administered 2015-05-01 – 2015-05-02 (×2): 20 mg via ORAL
  Filled 2015-05-01 (×2): qty 1

## 2015-05-01 MED ORDER — FUROSEMIDE 10 MG/ML IJ SOLN
40.0000 mg | Freq: Four times a day (QID) | INTRAMUSCULAR | Status: AC
  Administered 2015-05-01 (×3): 40 mg via INTRAVENOUS
  Filled 2015-05-01 (×2): qty 4

## 2015-05-01 MED ORDER — FENTANYL CITRATE (PF) 100 MCG/2ML IJ SOLN
12.5000 ug | Freq: Once | INTRAMUSCULAR | Status: AC
Start: 2015-05-01 — End: 2015-05-01
  Administered 2015-05-01: 12.5 ug via INTRAVENOUS
  Filled 2015-05-01: qty 2

## 2015-05-01 NOTE — Progress Notes (Signed)
[facility] PROGRESS NOTE  Daniel SouthwardDanny Maynard ZOX:[id number] DOB: 02/17/1956 DOA: 04/29/2015 PCP: No primary care provider on file. Brief History: 59 y/o  with h/o, DM, chronic HFpEF, CKD (baseline Scr 1.7 - 2.0), chronic respiratory failure with tracheostomy (night time ventilator use and daytime trach collar), [facility] was admitted for acute encephalopathy of unclear etiology. He has been progressively getting confused after the down size of his tracheostomy from size 8 to 7 XL. [facility]. He was found to be fluid overloaded and his blood cultures showed 1/2 gram positive cocci on 12/24.   Assessment/Plan: 1. Acute respiratory failure with hypoxia and hypercarbia and fluid overload: Admitted to step down and starte don IV lasix and diuresis.  He has diuresed about 2 lit since admission,  Continue vent at nigh and keep sats greater than 88%  2. Acute on chronic diastolic heart failure: Echo pending.  Lasix and diuresis.  Strict intake and out put and daily weights.    Stage 3 CKD; His creatinine appears to be at baseline, as we have started him on lasix, monitor renal parameters on lasix.    Chronic stable anemia probably from anemia of chronic disease.     Acute encephalopathy probably from hypercarbia and respiratory acidosis.  He is alert today .    Diabetes mellitus: CBG (last 3)   Recent Labs  05/01/15 0817 05/01/15 1230 05/01/15 1559  GLUCAP 93 123* 116*    Resume SSI.       Code Status: FULL CODE.  Family Communication: none at bedside Disposition Plan: pending PT eval.    Consultants:  PCCM   Procedures:  None   Antibiotics: none  HPI/Subjective:   Objective: Filed Vitals:   05/01/15 1538 05/01/15 1611  BP:  137/83  Pulse: 90 89  Temp:  98.8 F (37.1 C)  Resp: 22 21    Intake/Output Summary (Last 24 hours) at 05/01/15 1658 Last data filed at 05/01/15  0900  Gross per 24 hour  Intake    710 ml  Output   1425 ml  Net   -715 ml   Filed Weights   04/30/15 0000 04/30/15 0500 04/30/15 1752  Weight: 155.1 kg (341 lb 14.9 oz) 153.2 kg (337 lb 11.9 oz) 156.491 kg (345 lb)    Exam:   General:  Alert afebrile comfortable. Still appears confused.   Cardiovascular: s1s2, murmer present.   Respiratory: diminished at bases, no wheezing or rhonchi  Abdomen: soft non tender non distended bowel sounds  Musculoskeletal: pedal edema 2+  Data Reviewed: Basic Metabolic Panel:  Recent Labs Lab 04/29/15 1944 04/30/15 0105  NA 141 139  K 5.2* 5.5*  CL 100* 100*  CO2 33* 30  GLUCOSE 91 84  BUN 56* 59*  CREATININE 2.01* 2.14*  CALCIUM 8.6* 8.4*  MG  --  1.5*  PHOS  --  3.9   Liver Function Tests:  Recent Labs Lab 04/29/15 1944  AST 33  ALT 30  ALKPHOS 107  BILITOT 1.5*  PROT 8.2*  ALBUMIN 2.0*    Recent Labs Lab 04/30/15 0105  LIPASE 16   No results for input(s): AMMONIA in the last 168 hours. CBC:  Recent Labs Lab 04/29/15 2004 04/30/15 0105 05/01/15 1415  WBC 8.9 8.1 7.9  NEUTROABS 5.9  --  5.3  HGB 10.7* 11.7* 10.8*  HCT 34.9* 37.0* 34.3*  MCV 87.3 86.0 84.1  PLT 115* 102* 98*  Cardiac Enzymes: No results for input(s): CKTOTAL, CKMB, CKMBINDEX, TROPONINI in the last 168 hours. BNP (last 3 results)  Recent Labs  04/30/15 0105  BNP 330.7*    ProBNP (last 3 results) No results for input(s): PROBNP in the last 8760 hours.  CBG:  Recent Labs Lab 04/30/15 1752 04/30/15 2218 05/01/15 0817 05/01/15 1230 05/01/15 1559  GLUCAP 86 88 93 123* 116*    Recent Results (from the past 240 hour(s))  MRSA PCR Screening     Status: None   Collection Time: 04/29/15 11:57 PM  Result Value Ref Range Status   MRSA by PCR NEGATIVE NEGATIVE Final    Comment:        The GeneXpert MRSA Assay (FDA approved for NASAL specimens only), is one component of a comprehensive MRSA colonization surveillance  program. It is not intended to diagnose MRSA infection nor to guide or monitor treatment for MRSA infections.   Culture, blood (routine x 2)     Status: None (Preliminary result)   Collection Time: 04/30/15  1:05 AM  Result Value Ref Range Status   Specimen Description BLOOD RIGHT HAND  Final   Special Requests IN PEDIATRIC BOTTLE  Final   Culture NO GROWTH 1 DAY  Final   Report Status PENDING  Incomplete  Culture, blood (routine x 2)     Status: None (Preliminary result)   Collection Time: 04/30/15  1:20 AM  Result Value Ref Range Status   Specimen Description BLOOD LEFT HAND  Final   Special Requests IN PEDIATRIC BOTTLE  Final   Culture  Setup Time   Final    AEROBIC BOTTLE ONLY GRAM POSITIVE COCCI IN PAIRS IN CLUSTERS CRITICAL RESULT CALLED TO, READ BACK BY AND VERIFIED WITH: A.ABSA,RN [id number] 05/01/15 M.CAMPBELL    Culture CULTURE REINCUBATED FOR BETTER GROWTH  Final   Report Status PENDING  Incomplete     Studies: Ct Head Wo Contrast  04/29/2015  CLINICAL DATA:  59 year old male with altered mental status EXAM: CT HEAD WITHOUT CONTRAST TECHNIQUE: Contiguous axial images were obtained from the base of the skull through the vertex without intravenous contrast. COMPARISON:  None. FINDINGS: The ventricles and sulci are appropriate in size for patient's age. Mild periventricular and deep white matter hypodensities represent chronic microvascular ischemic changes. There is no intracranial hemorrhage. No mass effect or midline shift identified. The visualized paranasal sinuses and mastoid air cells are well aerated. The calvarium is intact. IMPRESSION: No acute intracranial hemorrhage. Mild chronic microvascular ischemic disease. If symptoms persist and there are no contraindications, MRI may provide better evaluation if clinically indicated Electronically Signed   By: Elgie Collard M.D.   On: 04/29/2015 23:51   Dg Chest Port 1 View  05/01/2015  CLINICAL DATA:  Pneumonia.  EXAM: PORTABLE CHEST 1 VIEW COMPARISON:  04/29/2015 FINDINGS: The tracheostomy tube tip is above the carina. Left chest wall pacer device is noted with lead in the right atrial appendage and right ventricle. Heart size is normal. The lung volumes are low. Pleural effusions and interstitial edema appears similar to previous exam. IMPRESSION: 1. Similar appearance of CHF pattern. 2. Stable support apparatus. Electronically Signed   By: Signa Kell M.D.   On: 05/01/2015 10:40   Dg Chest Portable 1 View  04/29/2015  CLINICAL DATA:  59 year old male with hypoxia EXAM: PORTABLE CHEST 1 VIEW COMPARISON:  None. FINDINGS: Single-view of the chest demonstrates cardiomegaly with increased central vascular prominence compatible with congestive changes. There are mild diffuse bilateral  nodular airspace densities. Bibasilar dependent atelectatic changes noted. There is no focal consolidation or pneumothorax. Trace right pleural effusion may be present. Left pectoral pacemaker device. The osseous structures are grossly unremarkable. Tracheostomy above the carina. IMPRESSION: Cardiomegaly with congestive changes. Superimposed pneumonia is not excluded. Electronically Signed   By: Elgie Collard M.D.   On: 04/29/2015 20:00    Scheduled Meds: . budesonide  1 mg Nebulization BID  . carvedilol  25 mg Oral BID WC  . chlorhexidine  15 mL Mouth/Throat BID  . dronedarone  400 mg Oral BID WC  . famotidine  20 mg Oral QHS  . febuxostat  40 mg Oral Daily  . heparin  5,000 Units Subcutaneous 3 times per day  . insulin aspart  2-6 Units Subcutaneous TID WC  . ipratropium-albuterol  3 mL Nebulization Q6H   Continuous Infusions:   Active Problems:   Encephalopathy acute   Respiratory failure, acute and chronic (HCC)   Tracheostomy dependence (HCC)   Pneumonia    Time spent: 25 minutes.     [facility]  [facility] Pager [phone]  If 7PM-7AM, please contact night-coverage at [email or web address], password  [facility] 05/01/2015, 4:58 PM  LOS: 2 days

## 2015-05-01 NOTE — Progress Notes (Signed)
PULMONARY / CRITICAL CARE MEDICINE   Name: Daniel SouthwardDanny Maynard MRN: [id number] DOB: 12/29/1955    ADMISSION DATE:  04/29/2015  CHIEF COMPLAINT:  Acute encephalopathy   HISTORY OF PRESENT ILLNESS:    [age] yoAAM with h/o, DM, chronic HFpEF, CKD (baseline Scr 1.7 - 2.0), chronic respiratory failure with tracheostomy (night time ventilator use and daytime trach collar).  He is currently encephalopathic and a poor historian, his HPI is limited to EMS and ED reports.  [facility]  Per report over the last two days the patient has become progressively encephalopathic after the downsize of his tracheostomy from an 8 to a 7XLT Shiley.  He has also refused his diuretic and reportedly refused ABGs.  Reportedly he has required increased suction due to increased secretions  SUBJECTIVE: Nurse in room. Oximetry 85%- nurse indicates unreliable and she is changing it out. He denies dyspnea. Blood culture 1/2 Pos GPCs this AM.  VITAL SIGNS: Temp:  [98.4 F (36.9 C)-99.8 F (37.7 C)] 99.6 F (37.6 C) (12/25 0819) Pulse Rate:  [78-92] 90 (12/25 0819) Resp:  [15-31] 15 (12/25 0819) BP: (108-186)/(65-123) 128/70 mmHg (12/25 0730) SpO2:  [92 %-100 %] 99 % (12/25 0819) FiO2 (%):  [30 %-40 %] 40 % (12/25 0752) Weight:  [156.491 kg (345 lb)] 156.491 kg (345 lb) (12/24 1752)   HEMODYNAMICS:   VENTILATOR SETTINGS: Vent Mode:  [-] PRVC FiO2 (%):  [30 %-40 %] 40 % Set Rate:  [22 bmp] 22 bmp Vt Set:  [500 mL] 500 mL PEEP:  [5 cmH20] 5 cmH20 Plateau Pressure:  [19 cmH20-20 cmH20] 20 cmH20   INTAKE / OUTPUT:  Intake/Output Summary (Last 24 hours) at 05/01/15 [id number] Last data filed at 05/01/15 0424  Gross per 24 hour  Intake    520 ml  Output   1875 ml  Net  -1355 ml   PHYSICAL EXAMINATION: General:  NAD, alert Neuro: Grossly non-focal, seems aware of surroundings but hard to assess comprehension HEENT:  Tracheostomy in place, site is c/d/i.  PERLA,  EOMI Cardiovascular:  RRR, s1/s2, 2/6 holosystolic murmur LSB 4rth intercostal space, no radiation Lungs:  Diffusely decreased breath sounds.  No wheezing or rhonchi, . Abdomen:  Soft, normal bowel sounds non tender. Chronic foley Musculoskeletal:  Moves all extremities equally Extremities: 1-2 + pitting edema b/l LE  LABS:  CBC  Recent Labs Lab 04/29/15 2004 04/30/15 0105  WBC 8.9 8.1  HGB 10.7* 11.7*  HCT 34.9* 37.0*  PLT 115* 102*   Coag's No results for input(s): APTT, INR in the last 168 hours. BMET  Recent Labs Lab 04/29/15 1944 04/30/15 0105  NA 141 139  K 5.2* 5.5*  CL 100* 100*  CO2 33* 30  BUN 56* 59*  CREATININE 2.01* 2.14*  GLUCOSE 91 84   Electrolytes  Recent Labs Lab 04/29/15 1944 04/30/15 0105  CALCIUM 8.6* 8.4*  MG  --  1.5*  PHOS  --  3.9   Sepsis Markers No results for input(s): LATICACIDVEN, PROCALCITON, O2SATVEN in the last 168 hours. ABG  Recent Labs Lab 04/29/15 2109 04/30/15 0504  PHART 7.303* 7.532*  PCO2ART 67.9* 40.1  PO2ART 48.0* 68.0*   Liver Enzymes  Recent Labs Lab 04/29/15 1944  AST 33  ALT 30  ALKPHOS 107  BILITOT 1.5*  ALBUMIN 2.0*   Cardiac Enzymes No results for input(s): TROPONINI, PROBNP in the last 168 hours. Glucose  Recent Labs Lab 04/30/15 1155 04/30/15 1752 04/30/15 2218 05/01/15 [id number]  GLUCAP 89 86 88 93    Imaging No results found. I reviewed CXR myself, trach ok.  ASSESSMENT / PLAN: 59yo male with acute encephalopathy likely secondary to hypercapnic respiratory failure that is multifactorial and inculdes, pulmonary edema from decompensated CHF as well as down size trach and vent non adherence.  PULMONARY A:Acute on chronic hypercapnic respiratory failure and pulmonary edema and a respiratory acidosis. P:    - Lasix 40 mg IV q6 hours x3 doses.  - Vent at night and ATC during the day.  - Titrate O2 for sat of 88-92%.  CARDIOVASCULAR A: Decompensated HFpEF (EF 55-60 on TTE at  [facility] in 03/2015) with hypervolemia and resulting pulmonary edema.   P:   - No chest pain and negative trop make ACS unlikely.  - Lasix 40 mg IV q6 hours x3 doses.  - Check BNP.  - Complete TTE pending.  - Dry weight unknown.  RENAL A:  CKD.  Per chart review there have been reports that his BL Scr is 0.7.  However, in CareEverywhere his creatinine has been 1.8-2.0 at [facility] last month and even as far back as 2014 he had levels as high as 2.  Per chart review he carries a diagnosis of cardiorenal syndrome.  Respiratory acidosis with partial compensation.  Hyperkalemia. P:    - Urine studies pending.  - No indications for HD at this time.  - Diureses as ordered.  - Foley inplace by urology do not remove.  HEMATOLOGIC A:  Chronic stable anemia.  Likely 2/2 chronic disease vs renal insufficiency.  Current Hb is improved since last month at [facility]. P:   - Iron studies, reticulocyte count and epo level  INFECTIOUS A:  Given his hypercapnea and lack of other septic physiology I have a low suspicion for sepsis or neurologic infection.  Will evaluate but hold Abx at this time. Positive blood culture 1/2 GPCs may be contaminant. P:   BCx2 05/01/2015 CBC, CXR Suggest skin care consult  ENDOCRINE A:  DM   P:    - SSI AC/HS.  NEUROLOGIC A:  Acute encephalopathy, likely 2/2 hypercapnia.  Neurologic exam is non-focal. P:    - Hold tramadol  - Hold gabapentin  - CT brain negative.   - Start heart healthy diet. - Heparin sub cutaneously - Full code/full tube  Discussed with TRH, transfered to SDU and to [facility] with PCCM as consult.  CD Bellami Farrelly, M.D. [facility] Pulmonary/Critical Care Medicine. Pager: [phone]. After hours pager: [phone].   05/01/2015, 9:28 AM

## 2015-05-01 NOTE — Progress Notes (Addendum)
CRITICAL VALUE ALERT  Critical value received: Blood cultures aerobic bottle, gram + cocci in pairs.   Date of notification:  05/01/2015  Time of notification:  0558  Critical value read back:Yes.    Nurse who received alert:  Julious OkaKarimah Abdussalaam RN   MD notified (1st page):  Dr. Marchelle Gearingamaswamy  Time of first page:  0558  MD notified (2nd page):  Time of second page:  Responding MD:  Dr. Marchelle Gearingamaswamy  Time MD responded:

## 2015-05-01 NOTE — Progress Notes (Signed)
eLink Physician-Brief Progress Note Patient Name: Daniel SouthwardDanny Corsello DOB: 01/17/1956 MRN: [id number]   Date of Service  05/01/2015  HPI/Events of Note  SHULDER PAIN - sAYS MORPHINE MAKES HIM CRAZY. aND REFUSES HYDROCODONE  Allergies  Allergen Reactions  . Hydrocodone   . Morphine And Related   . Zosyn [Piperacillin Sod-Tazobactam So]      eICU Interventions  FENTANYL LOW DOSE X 1 - HE REPORTS HE HAS NEVER TAKEN IT BEFORE     Intervention Category Intermediate Interventions: Pain - evaluation and management  Preeya Cleckley 05/01/2015, 4:35 AM

## 2015-05-01 NOTE — Progress Notes (Signed)
eLink Physician-Brief Progress Note Patient Name: Daniel SouthwardDanny Marolf DOB: 11/02/1955 MRN: [id number]   Date of Service  05/01/2015  HPI/Events of Note  Results for orders placed or performed during the hospital encounter of 04/29/15  MRSA PCR Screening     Status: None   Collection Time: 04/29/15 11:57 PM  Result Value Ref Range Status   MRSA by PCR NEGATIVE NEGATIVE Final    Comment:        The GeneXpert MRSA Assay (FDA approved for NASAL specimens only), is one component of a comprehensive MRSA colonization surveillance program. It is not intended to diagnose MRSA infection nor to guide or monitor treatment for MRSA infections.   Culture, blood (routine x 2)     Status: None (Preliminary result)   Collection Time: 04/30/15  1:20 AM  Result Value Ref Range Status   Specimen Description BLOOD LEFT HAND  Final   Special Requests IN PEDIATRIC BOTTLE 3ML  Final   Culture  Setup Time   Final    AEROBIC BOTTLE ONLY GRAM POSITIVE COCCI IN PAIRS IN CLUSTERS CRITICAL RESULT CALLED TO, READ BACK BY AND VERIFIED WITH: A.ABSA,RN [id number] 05/01/15 M.CAMPBELL    Culture PENDING  Incomplete   Report Status PENDING  Incomplete     eICU Interventions  mrsa pcr negative GPC in aerobic only No results for input(s): PROCALCITON in the last 168 hours.  Recent Labs Lab 04/29/15 2004 04/30/15 0105  WBC 8.9 8.1   No results for input(s): LATICACIDVEN, PROCALCITON in the last 168 hours.  Filed Vitals:   04/30/15 2345 05/01/15 0253 05/01/15 0313 05/01/15 0422  BP: 128/75  108/65 122/70  Pulse: 91  91 91  Temp: 99.8 F (37.7 C)   99.5 F (37.5 C)  TempSrc: Oral   Oral  Resp: 22  22 24   Height:      Weight:      SpO2: 98% 96% 98% 96%      Bedside MD to decide on abx     Intervention Category Intermediate Interventions: Diagnostic test evaluation  Bethenny Losee 05/01/2015, 6:06 AM

## 2015-05-02 ENCOUNTER — Inpatient Hospital Stay (HOSPITAL_COMMUNITY): Payer: Medicare Other

## 2015-05-02 DIAGNOSIS — E8770 Fluid overload, unspecified: Secondary | ICD-10-CM

## 2015-05-02 DIAGNOSIS — R109 Unspecified abdominal pain: Secondary | ICD-10-CM | POA: Insufficient documentation

## 2015-05-02 LAB — BASIC METABOLIC PANEL
ANION GAP: 9 (ref 5–15)
BUN: 43 mg/dL — AB (ref 6–20)
CHLORIDE: 96 mmol/L — AB (ref 101–111)
CO2: 35 mmol/L — ABNORMAL HIGH (ref 22–32)
CREATININE: 1.29 mg/dL — AB (ref 0.61–1.24)
Calcium: 8.3 mg/dL — ABNORMAL LOW (ref 8.9–10.3)
GFR calc non Af Amer: 60 mL/min — ABNORMAL LOW (ref >60)
Glucose, Bld: 127 mg/dL — ABNORMAL HIGH (ref 65–99)
POTASSIUM: 3.7 mmol/L (ref 3.5–5.1)
SODIUM: 140 mmol/L (ref 135–145)

## 2015-05-02 LAB — GLUCOSE, CAPILLARY
GLUCOSE-CAPILLARY: 141 mg/dL — AB (ref 65–99)
Glucose-Capillary: 116 mg/dL — ABNORMAL HIGH (ref 65–99)
Glucose-Capillary: 168 mg/dL — ABNORMAL HIGH (ref 65–99)
Glucose-Capillary: 106 mg/dL — ABNORMAL HIGH (ref 65–99)

## 2015-05-02 LAB — CBC
HCT: 33.8 % — ABNORMAL LOW (ref 39.0–52.0)
HEMOGLOBIN: 10.8 g/dL — AB (ref 13.0–17.0)
MCH: 26.9 pg (ref 26.0–34.0)
MCHC: 32 g/dL (ref 30.0–36.0)
MCV: 84.1 fL (ref 78.0–100.0)
Platelets: 94 10*3/uL — ABNORMAL LOW (ref 150–400)
RBC: 4.02 MIL/uL — AB (ref 4.22–5.81)
RDW: 22.3 % — ABNORMAL HIGH (ref 11.5–15.5)
WBC: 8.1 10*3/uL (ref 4.0–10.5)

## 2015-05-02 LAB — TROPONIN I
TROPONIN I: 0.04 ng/mL — AB (ref <0.031)
Troponin I: 0.03 ng/mL (ref <0.031)
Troponin I: 0.04 ng/mL — ABNORMAL HIGH (ref <0.031)

## 2015-05-02 MED ORDER — GI COCKTAIL ~~LOC~~
30.0000 mL | Freq: Once | ORAL | Status: AC
  Administered 2015-05-02: 30 mL via ORAL
  Filled 2015-05-02: qty 30

## 2015-05-02 MED ORDER — GI COCKTAIL ~~LOC~~: 30.0000 mL | Freq: Three times a day (TID) | ORAL | Status: DC | PRN

## 2015-05-02 MED ORDER — FENTANYL CITRATE (PF) 100 MCG/2ML IJ SOLN
12.5000 ug | Freq: Once | INTRAMUSCULAR | Status: AC
  Administered 2015-05-02: 12.5 ug via INTRAVENOUS
  Filled 2015-05-02: qty 2

## 2015-05-02 MED ORDER — FUROSEMIDE 10 MG/ML IJ SOLN
40.0000 mg | Freq: Two times a day (BID) | INTRAMUSCULAR | Status: DC
  Administered 2015-05-02 – 2015-05-03 (×2): 40 mg via INTRAVENOUS
  Filled 2015-05-02 (×2): qty 4

## 2015-05-02 MED ORDER — POTASSIUM CHLORIDE CRYS ER 20 MEQ PO TBCR
20.0000 meq | EXTENDED_RELEASE_TABLET | Freq: Every day | ORAL | Status: DC
  Administered 2015-05-02 – 2015-05-03 (×2): 20 meq via ORAL
  Filled 2015-05-02 (×2): qty 1

## 2015-05-02 MED ORDER — ALUM & MAG HYDROXIDE-SIMETH 200-200-20 MG/5ML PO SUSP: 30.0000 mL | Freq: Four times a day (QID) | ORAL | Status: DC | PRN

## 2015-05-02 NOTE — Progress Notes (Signed)
[facility] PROGRESS NOTE  Daniel Maynard ZOX:[id number] DOB: [birth year] DOA: 04/29/2015 PCP: No primary care provider on file. Brief History: 59 y/o  with h/o, DM, chronic HFpEF, CKD (baseline Scr 1.7 - 2.0), chronic respiratory failure with tracheostomy (night time ventilator use and daytime trach collar), [facility] was admitted for acute encephalopathy of unclear etiology. He has been progressively getting confused after the down size of his tracheostomy from size 8 to 7 XL. He was initially admitted by [facility] and transferred to [facility] for further management. He was found to be fluid overloaded and his blood cultures showed 1/2 gram positive cocci on 12/24.   Assessment/Plan: 1. Acute respiratory failure with hypoxia and hypercarbia and fluid overload: Admitted to step down and started on IV lasix and diuresis.  He has diuresed about 5 lit since admission,  Continue vent at night and keep sats greater than 88%  2. Acute on chronic diastolic heart failure: Echo done , showed 65% LVEF, wall motion normal, no regional wall abnormalities, dopplers are consistent with restrictive physiology. Calcified AV valve with mild AS.  Lasix and diuresis. Resume coreg. DIURESED about 5 liters since admission.  Strict intake and out put and daily weights.  Out of bed in chair today.  Physical therapy eval.    Stage 3 CKD; His creatinine appears to be at baseline, as we have started him on lasix, monitor renal parameters on lasix.  His creatinine is much improved. Today.  Potassium supplementation.   Hypomagnesemia: Repleted. Repeat in am.    Chronic stable anemia probably from anemia of chronic disease.  Stable around 10.   Acute encephalopathy probably from hypercarbia and respiratory acidosis.  He is alert today . Appears to have resolved.   Thrombocytopenia: Platelets less than 100,000, stopped sq heparin.  Not clear if this is chronic? Monitor platelet count in am.  SCD's for  DVT prophylaxis.     Diabetes mellitus: CBG (last 3)   Recent Labs  05/02/15 0825 05/02/15 1328 05/02/15 1722  GLUCAP 116* 141* 168*   hgba1c ordered for in am.   Resume SSI.    Abdominal discomfort with bloating : Order maalox and ordered abd film.  Gram positive cocci 1/2 blood culture from 12/24: Found to be contaminant a coag neg staph, repeat cultures done on 12/25 and negative so far.    Code Status: FULL CODE.  Family Communication: none at bedside Disposition Plan: pending PT eval.    Consultants:  PCCM   Procedures:  None   Antibiotics: none  HPI/Subjective: Reports having abdominal discomfort, bm yesterday.  Objective: Filed Vitals:   05/02/15 1606 05/02/15 1725  BP: 115/85 116/98  Pulse: 91 94  Temp:  98.6 F (37 C)  Resp: 25 20    Intake/Output Summary (Last 24 hours) at 05/02/15 1742 Last data filed at 05/02/15 1724  Gross per 24 hour  Intake    360 ml  Output   2200 ml  Net  -1840 ml   Filed Weights   04/30/15 1752 05/01/15 0600 05/02/15 0500  Weight: 156.491 kg (345 lb) 156.945 kg (346 lb) 157 kg (346 lb 2 oz)    Exam:   General:  Alert afebrile comfortable. Still appears confused.   Cardiovascular: s1s2, murmer present.   Respiratory: diminished at bases, no wheezing or rhonchi  Abdomen: soft non tender non distended bowel sounds  Musculoskeletal: pedal edema 2+  Data Reviewed: Basic Metabolic Panel:  Recent Labs Lab 04/29/15 1944 04/30/15 0105 05/01/15 1850 05/02/15  0040[place] <BAlc  -8181 VSheVSherMSamson Frederic Aris175mmryNanine MeansEASUREMKentuckyENT> MeaEula [phone] FredericArisFe15mSteNanine MeansexicoBoRachaelNew <MEASUREMKentuckyENT>MexiEula [phone] FredericArisTA49mG>TNanine<MEASUREMKentuckyENT> MeaEula [phone] Frederi<MEASUREMKentuckyENT>csa Eula [phone] Frederic Arisw16m MeNanine Meanshael FStevan Borny he GhNew Alcide Clever Ghee ENRudi [place]ocRachael FStNew MexicoevRachael FeStevan BornXT TAG>FSRudiAlcide <MEASUFaythe Gheer GNew MexicoheRachael FeStevan BornR di CoRachaelFaythe GheeNew Mexicortus  Specimen Description BLOOD LEFT ARM  Final   Special Requests BOTTLES DRAWN AEROBIC AND ANAEROBIC 6CC  Final   Culture NO GROWTH < 24 HOURS  Final   Report Status PENDING  Incomplete     Studies: Dg Chest Port 1 View  05/02/2015  CLINICAL DATA:  Fluid overload. EXAM: PORTABLE CHEST 1 VIEW COMPARISON:  05/01/2015 and 04/29/2015. FINDINGS: The tracheostomy and left subclavian pacemaker leads appear unchanged. There is stable cardiomegaly. The pulmonary edema has resolved. There is no confluent airspace opacity or significant pleural effusion. IMPRESSION: Resolution of pulmonary edema.  Stable cardiomegaly. Electronically Signed   By: Carey Bullocks M.D.   On: 05/02/2015 09:49   Dg Chest Port 1 View  05/01/2015  CLINICAL DATA:  Pneumonia. EXAM: PORTABLE CHEST 1 VIEW COMPARISON:  04/29/2015 FINDINGS: The tracheostomy tube tip is above the carina. Left chest wall pacer device is noted with lead in the right atrial appendage and right ventricle. Heart size is normal. The lung volumes are low. Pleural effusions and interstitial edema appears similar to previous exam. IMPRESSION: 1. Similar appearance of CHF pattern. 2. Stable support apparatus. Electronically Signed   By: Signa Kell M.D.   On: 05/01/2015 10:40    Scheduled Meds: . budesonide  1 mg Nebulization BID  . carvedilol  25 mg Oral BID WC  . chlorhexidine  15 mL Mouth/Throat BID  . dronedarone  400 mg Oral BID WC  .  famotidine  20 mg Oral QHS  . febuxostat  40 mg Oral Daily  . heparin  5,000 Units Subcutaneous 3 times per day  . insulin aspart  2-6 Units Subcutaneous TID WC  . ipratropium-albuterol  3 mL Nebulization Q6H   Continuous Infusions:   Active Problems:   Encephalopathy acute   Respiratory failure, acute and chronic (HCC)   Tracheostomy dependence (HCC)   Pneumonia    Time spent: 25 minutes.     [facility]  [facility] Pager [phone]  If 7PM-7AM, please contact night-coverage at [email or web address], password [facility] 05/02/2015, 5:42 PM  LOS: 3 days

## 2015-05-02 NOTE — Evaluation (Signed)
Physical Therapy Evaluation Patient Details Name: Daniel Maynard MRN: [id number] DOB: 1956-04-07 Today's Date: 05/02/2015   History of Present Illness  89 yoAAM with h/o, DM, chronic HFpEF, CKD (baseline Scr 1.7 - 2.0), chronic respiratory failure with tracheostomy (night time ventilator use and daytime trach collar). He is currently encephalopathic and a poor historian, his HPI is limited to EMS and ED reports. No answer at [facility] (where he currently resides). Per report over the last two days the patient has become progressively encephalopathic after the downsize of his tracheostomy from an 8 to a 7XLT Shiley. He has also refused his diuretic and reportedly refused ABGs. Reportedly he has required increased suction due to increased secretions  Clinical Impression  Pt admitted with above diagnosis. Pt currently with functional limitations due to the deficits listed below (see PT Problem List). Pt will need LTACH to continue on d/c from hospital to continue therapy and weaning.  Will follow acutely.  Pt will benefit from skilled PT to increase their independence and safety with mobility to allow discharge to the venue listed below.     Follow Up Recommendations LTACH;Supervision/Assistance - 24 hour    Equipment Recommendations  Other (comment) (TBA)    Recommendations for Other Services       Precautions / Restrictions Precautions Precautions: Fall Restrictions Weight Bearing Restrictions: No      Mobility  Bed Mobility Overal bed mobility: Needs Assistance;+2 for physical assistance Bed Mobility: Supine to Sit     Supine to sit: Max assist;+2 for physical assistance;HOB elevated     General bed mobility comments: Took incr time and assist for pt to get to EOB using pad for helicopter technique to EOb.  Pt with posterior lean making it  difficult    Transfers Overall transfer level: Needs assistance Equipment used: Rolling walker (2 wheeled) Transfers: Sit to/from  Stand Sit to Stand: Max assist;From elevated surface (+3 assist)         General transfer comment: Even with +3 max assist pt was unable to clear bottom off bed to stand.  Pt did weight bear on bil LEs and hold onto RW for support but could not get upright.  Obtained Maximove lift to get pt into chair this way since pivot unsuccessful.    Ambulation/Gait                Stairs            Wheelchair Mobility    Modified Rankin (Stroke Patients Only)       Balance Overall balance assessment: Needs assistance;History of Falls Sitting-balance support: Bilateral upper extremity supported;Feet supported Sitting balance-Leahy Scale: Zero Sitting balance - Comments: pt required total to mod assist for balance at EOB with posterior lean that pt just could not sit up himself even with cues and his UEs. Sat a total of 15 min EOB with assist.  Postural control: Posterior lean Standing balance support: Bilateral upper extremity supported;During functional activity Standing balance-Leahy Scale: Zero Standing balance comment: could not stand fully upright with bottom not fully clearing bed.                              Pertinent Vitals/Pain Pain Assessment: No/denies pain  60% trach collar with VSS.     Home Living Family/patient expects to be discharged to:: Other (Comment) (LTAC)  Prior Function Level of Independence: Needs assistance   Gait / Transfers Assistance Needed: Pt states initially he was transferring with walker to chair and later in conversation he states he was being lifted with lift.    ADL's / Homemaking Assistance Needed: min to mod assist.         Hand Dominance        Extremity/Trunk Assessment   Upper Extremity Assessment: Defer to OT evaluation           Lower Extremity Assessment: RLE deficits/detail;LLE deficits/detail RLE Deficits / Details: appears grossly 3-/5 LLE Deficits / Details: appears  grossly 3-/5     Communication   Communication: Tracheostomy  Cognition Arousal/Alertness: Awake/alert Behavior During Therapy: Flat affect Overall Cognitive Status: Impaired/Different from baseline Area of Impairment: Following commands;Safety/judgement;Problem solving       Following Commands: Follows one step commands inconsistently;Follows one step commands with increased time Safety/Judgement: Decreased awareness of safety;Decreased awareness of deficits   Problem Solving: Difficulty sequencing;Requires verbal cues;Requires tactile cues;Decreased initiation;Slow processing General Comments: pt confused overall.  Slow to respond    General Comments General comments (skin integrity, edema, etc.): Took 4 persons to position pt in bari recliner once up via lift.  Pt looked good once in chair and was smiling.      Exercises General Exercises - Lower Extremity Ankle Circles/Pumps: AROM;Both;10 reps;Seated Long Arc Quad: AROM;Both;10 reps;Seated      Assessment/Plan    PT Assessment Patient needs continued PT services  PT Diagnosis Generalized weakness   PT Problem List Decreased balance;Decreased activity tolerance;Decreased mobility;Decreased knowledge of use of DME;Decreased safety awareness;Decreased knowledge of precautions;Cardiopulmonary status limiting activity;Obesity;Decreased coordination;Decreased cognition  PT Treatment Interventions DME instruction;Gait training;Functional mobility training;Therapeutic activities;Therapeutic exercise;Balance training;Cognitive remediation;Patient/family education   PT Goals (Current goals can be found in the Care Plan section) Acute Rehab PT Goals Patient Stated Goal: to go home PT Goal Formulation: With patient Time For Goal Achievement: 06/04/15 Potential to Achieve Goals: Good    Frequency Min 3X/week   Barriers to discharge Decreased caregiver support      Co-evaluation               End of Session Equipment  Utilized During Treatment: Gait belt;Oxygen (60% trach collar) Activity Tolerance: Patient limited by fatigue Patient left: in chair;with call bell/phone within reach;with nursing/sitter in room Nurse Communication: Mobility status;Need for lift equipment         Time: [id number] PT Time Calculation (min) (ACUTE ONLY): 53 min   Charges:   PT Evaluation $Initial PT Evaluation Tier I: 1 Procedure PT Treatments $Therapeutic Exercise: 8-22 mins $Therapeutic Activity: 23-37 mins   PT G CodesBerline Lopes:        Georgiann Neider F 05/02/2015, 3:46 PM Enter[employer] CorporationDawn Javanna Patin,PT Acute Rehabilitation [phone] (pager)

## 2015-05-02 NOTE — Progress Notes (Signed)
PULMONARY / CRITICAL CARE MEDICINE   Name: Daniel Maynard MRN: [id number] DOB: 1955/09/14    ADMISSION DATE:  04/29/2015  CHIEF COMPLAINT:  Acute encephalopathy   HISTORY OF PRESENT ILLNESS:    59 yoAAM with h/o, DM, chronic HFpEF, CKD (baseline Scr 1.7 - 2.0), chronic respiratory failure with tracheostomy (night time ventilator use and daytime trach collar).  He is currently encephalopathic and a poor historian, his HPI is limited to EMS and ED reports.  No answer at [facility] (where he currently resides).  Per report over the last two days the patient has become progressively encephalopathic after the downsize of his tracheostomy from an 8 to a 7XLT Shiley.  He has also refused his diuretic and reportedly refused ABGs.  Reportedly he has required increased suction due to increased secretions  SUBJECTIVE: Complained of chest pain over night and this morning, troponin negative  VITAL SIGNS: Temp:  [97.8 F (36.6 C)-99.1 F (37.3 C)] 98.7 F (37.1 C) (12/26 1330) Pulse Rate:  [87-92] 92 (12/26 1330) Resp:  [20-32] 25 (12/26 1330) BP: (115-155)/(79-108) 128/86 mmHg (12/26 1330) SpO2:  [92 %-98 %] 95 % (12/26 1347) FiO2 (%):  [40 %] 40 % (12/26 1347) Weight:  [157 kg (346 lb 2 oz)] 157 kg (346 lb 2 oz) (12/26 0500)   HEMODYNAMICS:   VENTILATOR SETTINGS: Vent Mode:  [-] PRVC FiO2 (%):  [40 %] 40 % Set Rate:  [22 bmp] 22 bmp Vt Set:  [500 mL] 500 mL PEEP:  [5 cmH20] 5 cmH20 Plateau Pressure:  [16 cmH20-17 cmH20] 17 cmH20   INTAKE / OUTPUT:  Intake/Output Summary (Last 24 hours) at 05/02/15 1511 Last data filed at 05/02/15 0500  Gross per 24 hour  Intake    360 ml  Output   1700 ml  Net  -1340 ml   PHYSICAL EXAMINATION: General: no distress, morbidly obese HENT: trach in place, #7 XLT PULM: CTA B, normal effort CV: RRR, distant heart sounds GI: soft, nontender Derm: no rash or breakdown  LABS:  CBC  Recent Labs Lab 04/30/15 0105 05/01/15 1415 05/02/15 0040   WBC 8.1 7.9 8.1  HGB 11.7* 10.8* 10.8*  HCT 37.0* 34.3* 33.8*  PLT 102* 98* 94*   Coag's No results for input(s): APTT, INR in the last 168 hours. BMET  Recent Labs Lab 04/30/15 0105 05/01/15 1850 05/02/15 0040  NA 139 140 140  K 5.5* 3.8 3.7  CL 100* 98* 96*  CO2 30 34* 35*  BUN 59* 47* 43*  CREATININE 2.14* 1.42* 1.29*  GLUCOSE 84 138* 127*   Electrolytes  Recent Labs Lab 04/30/15 0105 05/01/15 1850 05/02/15 0040  CALCIUM 8.4* 8.3* 8.3*  MG 1.5*  --   --   PHOS 3.9  --   --    Sepsis Markers No results for input(s): LATICACIDVEN, PROCALCITON, O2SATVEN in the last 168 hours. ABG  Recent Labs Lab 04/29/15 2109 04/30/15 0504  PHART 7.303* 7.532*  PCO2ART 67.9* 40.1  PO2ART 48.0* 68.0*   Liver Enzymes  Recent Labs Lab 04/29/15 1944  AST 33  ALT 30  ALKPHOS 107  BILITOT 1.5*  ALBUMIN 2.0*   Cardiac Enzymes  Recent Labs Lab 05/02/15 0040 05/02/15 0528  TROPONINI 0.04* 0.03   Glucose  Recent Labs Lab 05/01/15 0817 05/01/15 1230 05/01/15 1559 05/01/15 2342 05/02/15 0825 05/02/15 1328  GLUCAP 93 123* 116* 118* 116* 141*    Imaging Dg Chest Port 1 View  05/02/2015  CLINICAL DATA:  Fluid overload. EXAM:  PORTABLE CHEST 1 VIEW COMPARISON:  05/01/2015 and 04/29/2015. FINDINGS: The tracheostomy and left subclavian pacemaker leads appear unchanged. There is stable cardiomegaly. The pulmonary edema has resolved. There is no confluent airspace opacity or significant pleural effusion. IMPRESSION: Resolution of pulmonary edema.  Stable cardiomegaly. Electronically Signed   By: Carey BullocksWilliam  Veazey M.D.   On: 05/02/2015 09:49   CXR images reviewed, tracheostomy in place, cardiomegaly, clear lung fields  ASSESSMENT / PLAN: 59yo male with acute encephalopathy likely secondary to hypercapnic respiratory failure that is multifactorial but acutely due to pulmonary edema from decompensated CHF as well as down size trach and vent non  adherence.  PULMONARY A:Acute on chronic hypercapnic respiratory failure and pulmonary edema and a respiratory acidosis. > resolved, back to baseline vent settings Acute pulmonary edema from decompensated CHF P:   Would continue to try to keep net negative Not sure what his "dry weight" would be, will need to watch I/O and BMET closely Continue vent at night No plans to change tracheostomy size or status  Would make plans to send back to [facility]  CD [name], M.D. [facility] Pulmonary/Critical Care Medicine. Pager: [phone]. After hours pager: [phone].   05/02/2015, 3:11 PM

## 2015-05-03 LAB — GLUCOSE, CAPILLARY
Glucose-Capillary: 115 mg/dL — ABNORMAL HIGH (ref 65–99)
Glucose-Capillary: 150 mg/dL — ABNORMAL HIGH (ref 65–99)

## 2015-05-03 LAB — BASIC METABOLIC PANEL
ANION GAP: 10 (ref 5–15)
BUN: 41 mg/dL — ABNORMAL HIGH (ref 6–20)
CALCIUM: 8.4 mg/dL — AB (ref 8.9–10.3)
CO2: 33 mmol/L — ABNORMAL HIGH (ref 22–32)
Chloride: 96 mmol/L — ABNORMAL LOW (ref 101–111)
Creatinine, Ser: 1.47 mg/dL — ABNORMAL HIGH (ref 0.61–1.24)
GFR calc Af Amer: 59 mL/min — ABNORMAL LOW (ref >60)
GFR, EST NON AFRICAN AMERICAN: 51 mL/min — AB (ref >60)
GLUCOSE: 107 mg/dL — AB (ref 65–99)
Potassium: 3.6 mmol/L (ref 3.5–5.1)
SODIUM: 139 mmol/L (ref 135–145)

## 2015-05-03 LAB — CULTURE, BLOOD (ROUTINE X 2)

## 2015-05-03 LAB — BRAIN NATRIURETIC PEPTIDE: B Natriuretic Peptide: 209.8 pg/mL — ABNORMAL HIGH (ref 0.0–100.0)

## 2015-05-03 LAB — MAGNESIUM: MAGNESIUM: 1.3 mg/dL — AB (ref 1.7–2.4)

## 2015-05-03 MED ORDER — TORSEMIDE 10 MG PO TABS: 20.0000 mg | ORAL_TABLET | Freq: Every day | ORAL | Status: AC

## 2015-05-03 NOTE — Care Management Important Message (Signed)
Important Message  Patient Details  Name: Ulyses SouthwardDanny Frimpong MRN: [id number] Date of Birth: 04/04/1956   Medicare Important Message Given:  Yes    Kyla BalzarineShealy, Aniaya Bacha Abena 05/03/2015, 12:31 PM

## 2015-05-03 NOTE — Progress Notes (Signed)
Patient sister Ms Melissa Noonayron Dottie informed of possible  discharge back to [facility] today

## 2015-05-03 NOTE — Progress Notes (Signed)
[facility]

## 2015-05-03 NOTE — Progress Notes (Signed)
PULMONARY / CRITICAL CARE MEDICINE   Name: Daniel Maynard MRN: [id number] DOB: 1955-05-27    ADMISSION DATE:  04/29/2015  CHIEF COMPLAINT:  Acute encephalopathy   HISTORY OF PRESENT ILLNESS:    59 yoAAM with h/o, DM, chronic HFpEF, CKD (baseline Scr 1.7 - 2.0), chronic respiratory failure with tracheostomy (night time ventilator use and daytime trach collar).  He is currently encephalopathic and a poor historian, his HPI is limited to EMS and ED reports.  59 No answer at [facility] (where he currently resides).  Per report over the last two days the patient has become progressively encephalopathic after the downsize of his tracheostomy from an 8 to a 7XLT Shiley.  He has also refused his diuretic and reportedly refused ABGs.  Reportedly he has required increased suction due to increased secretions  SUBJECTIVE: No acute issues overnight. Still requiring vent support at night but tolerating TC with FiO2 of 40% while awake  VITAL SIGNS: Temp:  [98.6 F (37 C)-98.9 F (37.2 C)] 98.9 F (37.2 C) (12/27 0832) Pulse Rate:  [82-94] 92 (12/27 0832) Resp:  [18-26] 26 (12/27 0832) BP: (105-152)/(66-108) 152/97 mmHg (12/27 0832) SpO2:  [91 %-100 %] 97 % (12/27 0841) FiO2 (%):  [40 %] 40 % (12/27 0841) Weight:  [153.316 kg (338 lb)] 153.316 kg (338 lb) (12/27 0459)   HEMODYNAMICS:   VENTILATOR SETTINGS: Vent Mode:  [-] PRVC FiO2 (%):  [40 %] 40 % Set Rate:  [22 bmp] 22 bmp Vt Set:  [500 mL] 500 mL PEEP:  [5 cmH20] 5 cmH20 Plateau Pressure:  [18 cmH20-19 cmH20] 19 cmH20   INTAKE / OUTPUT:  Intake/Output Summary (Last 24 hours) at 05/03/15 1103 Last data filed at 05/03/15 0934  Gross per 24 hour  Intake      0 ml  Output   1275 ml  Net  -1275 ml   PHYSICAL EXAMINATION: General: Sitting in chair, no distress, morbidly obese HENT: trach in place, #7, oral mucosa moist PULM: CTA B, normal effort CV: RRR, distant heart sounds GI: soft, nontender Neuro: Alert and oriented x4, moves  all extremities, no focal deficits Skin: no rash or lesions  LABS:  CBC  Recent Labs Lab 04/30/15 0105 05/01/15 1415 05/02/15 0040  WBC 8.1 7.9 8.1  HGB 11.7* 10.8* 10.8*  HCT 37.0* 34.3* 33.8*  PLT 102* 98* 94*   Coag's No results for input(s): APTT, INR in the last 168 hours. BMET  Recent Labs Lab 05/01/15 1850 05/02/15 0040 05/03/15 0454  NA 140 140 139  K 3.8 3.7 3.6  CL 98* 96* 96*  CO2 34* 35* 33*  BUN 47* 43* 41*  CREATININE 1.42* 1.29* 1.47*  GLUCOSE 138* 127* 107*   Electrolytes  Recent Labs Lab 04/30/15 0105 05/01/15 1850 05/02/15 0040 05/03/15 0454  CALCIUM 8.4* 8.3* 8.3* 8.4*  MG 1.5*  --   --  1.3*  PHOS 3.9  --   --   --    Sepsis Markers No results for input(s): LATICACIDVEN, PROCALCITON, O2SATVEN in the last 168 hours. ABG  Recent Labs Lab 04/29/15 2109 04/30/15 0504  PHART 7.303* 7.532*  PCO2ART 67.9* 40.1  PO2ART 48.0* 68.0*   Liver Enzymes  Recent Labs Lab 04/29/15 1944  AST 33  ALT 30  ALKPHOS 107  BILITOT 1.5*  ALBUMIN 2.0*   Cardiac Enzymes  Recent Labs Lab 05/02/15 0040 05/02/15 0528 05/02/15 1526  TROPONINI 0.04* 0.03 0.04*   Glucose  Recent Labs Lab 05/01/15 2342 05/02/15 0825 05/02/15 1328  05/02/15 1722 05/02/15 2210 05/03/15 0830  GLUCAP 118* 116* 141* 168* 106* 115*    Imaging Dg Abd Portable 1v  05/02/2015  CLINICAL DATA:  Abdominal discomfort EXAM: PORTABLE ABDOMEN - 1 VIEW COMPARISON:  None FINDINGS: No dilated loops large or small bowel. Gas in the transverse colon descending colon. Gas the rectum. No pathologic calcifications. IMPRESSION: No acute findings. Electronically Signed   By: Genevive BiStewart  Edmunds M.D.   On: 05/02/2015 19:58   CXR images reviewed, tracheostomy in place, cardiomegaly, clear lung fields  ASSESSMENT / PLAN: 59yo male with acute encephalopathy likely secondary to hypercapnic respiratory failure that is multifactorial but acutely due to pulmonary edema from decompensated  CHF as well as down size trach and vent non adherence.  PULMONARY A:Acute on chronic hypercapnic respiratory failure and pulmonary edema and a respiratory acidosis. > resolved, back to baseline vent settings Acute pulmonary edema from decompensated CHF P:   Would continue to try to keep net negative in I/O Not sure what his "dry weight" would be, will need to watch I/O and BMET closely Continue vent at night and TC during the day. Keep #7 cuffed shiley trach; no plans to change tracheostomy size or status Can d/c back to [facility]  Continue to titrate FiO2 to keep O2 sat>92% Rest of treatment plan per primary team  Total time 30 minutes  Seriah Brotzman S. [facility] ANP-BC Pulmonary and Critical Care Medicine [facility] Pager: [phone]  05/03/2015, 11:03 AM

## 2015-05-03 NOTE — Discharge Summary (Signed)
Physician Discharge Summary  Daniel Maynard WUJ:[id number] DOB: 1955-12-11 DOA: 04/29/2015  PCP: No primary care provider on file.  Admit date: 04/29/2015 Discharge date: 05/03/2015  Time spent: 30 minutes  Recommendations for Outpatient Follow-up:  1. Please follow up with PCP as needed.  2. Please obtain a BMP to check renal function on torsemide.  3. Please continue vent at night and TC during the day.     Discharge Diagnoses:  Active Problems:   Encephalopathy acute   Respiratory failure, acute and chronic (HCC)   Tracheostomy dependence (HCC)   Pneumonia   Abdominal discomfort   Fluid overload   Discharge Condition: improved  Diet recommendation: low sodium diet.   Filed Weights   05/01/15 0600 05/02/15 0500 05/03/15 0459  Weight: 156.945 kg (346 lb) 157 kg (346 lb 2 oz) 153.316 kg (338 lb)    History of present illness:  59 y/o with h/o, DM, chronic HFpEF, CKD (baseline Scr 1.7 - 2.0), chronic respiratory failure with tracheostomy (night time ventilator use and daytime trach collar), from [facility] was admitted for acute encephalopathy of unclear etiology. He has been progressively getting confused after the down size of his tracheostomy from size 8 to 7 XL. He was initially admitted by [facility] and transferred to [facility] for further management. He was found to be fluid overloaded and his blood cultures showed 1/2 gram positive cocci on 12/24. it was found to be a contaminant.  Hospital Course:  1. Acute respiratory failure with hypoxia and hypercarbia and fluid overload: Admitted to step down and starte don IV lasix and diuresis.  He has diuresed about 5 lit since admission, stopped the lasix today and plan to discharge him on increased dose of torsemide from 10 mg to 20 mg daily.  CXR obtained and showed resolution of the pulmonary edema. bnp improved from 330 to 209.  Continue vent at nigh and keep sats greater than 88%  2. Acute on chronic diastolic heart  failure: Echo shows LVEF of 65% with restrictive physiology. He was on 10 mg of torsemide at home.  Increased the dose of torsemide to 20 mg daily on discharge.  Strict intake and out put and daily weights.  Monitor renal function in 1to 2 days.    Stage 3 CKD; His creatinine appears to be at baseline,  monitor renal parameters while on torsemide.    Chronic stable anemia probably from anemia of chronic disease.     Acute encephalopathy probably from hypercarbia and respiratory acidosis.  resolved He is alert and conversant  today .    Diabetes mellitus: CBG (last 3)   Recent Labs (last 2 labs)      Recent Labs  05/01/15 0817 05/01/15 1230 05/01/15 1559  GLUCAP 93 123* 116*      Resume SSI.    Abdominal discomfort with bloating : Order maalox and ordered abd film, which was negative and his abd discomfort resolved.   Gram positive cocci 1/2 blood culture from 12/24: Found to be contaminant a coag neg staph, repeat cultures done on 12/25 and negative so far.   Thrombocytopenia: Platelets less than 100,000, stopped sq heparin.  Not clear if this is chronic? Monitor platelet count intermittently as outpatient.          Procedures:  Vent at night.   Consultations:  PCCM  Discharge Exam: Filed Vitals:   05/03/15 1240 05/03/15 1400  BP: 98/80 113/94  Pulse: 91 93  Temp: 97.5 F (36.4 C)   Resp: 23  20    General: alert afebrile comfortable Cardiovascular: s1s2 Respiratory: diminished air entry at bases but no wheezing, rhonchi or crackles heard.   Discharge Instructions   Discharge Instructions    (HEART FAILURE PATIENTS) Call MD:  Anytime you have any of the following symptoms: 1) 3 pound weight gain in 24 hours or 5 pounds in 1 week 2) shortness of breath, with or without a dry hacking cough 3) swelling in the hands, feet or stomach 4) if you have to sleep on extra pillows at night in order to breathe.    Complete by:  As directed       Diet - low sodium heart healthy    Complete by:  As directed      Discharge instructions    Complete by:  As directed   Please follow upw ith PCP as needed.          Current Discharge Medication List    CONTINUE these medications which have CHANGED   Details  torsemide (DEMADEX) 10 MG tablet Take 2 tablets (20 mg total) by mouth daily.      CONTINUE these medications which have NOT CHANGED   Details  acetaminophen (TYLENOL) 325 MG tablet Take 325 mg by mouth every 6 (six) hours as needed for mild pain or moderate pain.     amLODipine (NORVASC) 10 MG tablet Take 10 mg by mouth daily.    aspirin EC 81 MG tablet Take 81 mg by mouth daily.    bisacodyl (DULCOLAX) 10 MG suppository Place 10 mg rectally as needed for mild constipation or moderate constipation.    budesonide (PULMICORT) 1 MG/2ML nebulizer solution Take 1 mg by nebulization 2 (two) times daily.    carvedilol (COREG) 25 MG tablet Take 25 mg by mouth 2 (two) times daily.    chlorhexidine (PERIDEX) 0.12 % solution Use as directed 15 mLs in the mouth or throat 2 (two) times daily.    colchicine 0.6 MG tablet Take 0.6 mg by mouth 2 (two) times daily.    diclofenac sodium (VOLTAREN) 1 % GEL Apply 1 application topically 4 (four) times daily.    dronedarone (MULTAQ) 400 MG tablet Take 400 mg by mouth 2 (two) times daily with a meal.    famotidine (PEPCID) 20 MG tablet Take 20 mg by mouth at bedtime.    febuxostat (ULORIC) 40 MG tablet Take 40 mg by mouth daily.    gabapentin (NEURONTIN) 100 MG capsule Take 200 mg by mouth 2 (two) times daily.    insulin aspart (NOVOLOG) 100 UNIT/ML injection Inject 3-20 Units into the skin 3 (three) times daily with meals as needed for high blood sugar (151-200 = 3 units, 201-300 = 5 units, 301-400 = 10 units, 401-500 = 15 units, 501+ = 20 units).     ipratropium-albuterol (DUONEB) 0.5-2.5 (3) MG/3ML SOLN Take 3 mLs by nebulization every 6 (six) hours.    traMADol (ULTRAM) 50 MG  tablet Take 50 mg by mouth every 6 (six) hours as needed for moderate pain or severe pain.      STOP taking these medications     furosemide (LASIX) 40 MG tablet        Allergies  Allergen Reactions  . Hydrocodone   . Morphine And Related   . Zosyn [Piperacillin Sod-Tazobactam So]       The results of significant diagnostics from this hospitalization (including imaging, microbiology, ancillary and laboratory) are listed below for reference.    Significant Diagnostic Studies: Ct  Head Wo Contrast  04/29/2015  CLINICAL DATA:  [age] year old male with altered mental status EXAM: CT HEAD WITHOUT CONTRAST TECHNIQUE: Contiguous axial images were obtained from the base of the skull through the vertex without intravenous contrast. COMPARISON:  None. FINDINGS: The ventricles and sulci are appropriate in size for patient's age. Mild periventricular and deep white matter hypodensities represent chronic microvascular ischemic changes. There is no intracranial hemorrhage. No mass effect or midline shift identified. The visualized paranasal sinuses and mastoid air cells are well aerated. The calvarium is intact. IMPRESSION: No acute intracranial hemorrhage. Mild chronic microvascular ischemic disease. If symptoms persist and there are no contraindications, MRI may provide better evaluation if clinically indicated Electronically Signed   By: Elgie CollardArash  Radparvar M.D.   On: 04/29/2015 23:51   Dg Chest Port 1 View  05/02/2015  CLINICAL DATA:  Fluid overload. EXAM: PORTABLE CHEST 1 VIEW COMPARISON:  05/01/2015 and 04/29/2015. FINDINGS: The tracheostomy and left subclavian pacemaker leads appear unchanged. There is stable cardiomegaly. The pulmonary edema has resolved. There is no confluent airspace opacity or significant pleural effusion. IMPRESSION: Resolution of pulmonary edema.  Stable cardiomegaly. Electronically Signed   By: Carey BullocksWilliam  Veazey M.D.   On: 05/02/2015 09:49   Dg Chest Port 1 View  05/01/2015   CLINICAL DATA:  Pneumonia. EXAM: PORTABLE CHEST 1 VIEW COMPARISON:  04/29/2015 FINDINGS: The tracheostomy tube tip is above the carina. Left chest wall pacer device is noted with lead in the right atrial appendage and right ventricle. Heart size is normal. The lung volumes are low. Pleural effusions and interstitial edema appears similar to previous exam. IMPRESSION: 1. Similar appearance of CHF pattern. 2. Stable support apparatus. Electronically Signed   By: Signa Kellaylor  Stroud M.D.   On: 05/01/2015 10:40   Dg Chest Portable 1 View  04/29/2015  CLINICAL DATA:  59 year old male with hypoxia EXAM: PORTABLE CHEST 1 VIEW COMPARISON:  None. FINDINGS: Single-view of the chest demonstrates cardiomegaly with increased central vascular prominence compatible with congestive changes. There are mild diffuse bilateral nodular airspace densities. Bibasilar dependent atelectatic changes noted. There is no focal consolidation or pneumothorax. Trace right pleural effusion may be present. Left pectoral pacemaker device. The osseous structures are grossly unremarkable. Tracheostomy above the carina. IMPRESSION: Cardiomegaly with congestive changes. Superimposed pneumonia is not excluded. Electronically Signed   By: Elgie CollardArash  Radparvar M.D.   On: 04/29/2015 20:00   Dg Abd Portable 1v  05/02/2015  CLINICAL DATA:  Abdominal discomfort EXAM: PORTABLE ABDOMEN - 1 VIEW COMPARISON:  None FINDINGS: No dilated loops large or small bowel. Gas in the transverse colon descending colon. Gas the rectum. No pathologic calcifications. IMPRESSION: No acute findings. Electronically Signed   By: Genevive BiStewart  Edmunds M.D.   On: 05/02/2015 19:58    Microbiology: Recent Results (from the past 240 hour(s))  MRSA PCR Screening     Status: None   Collection Time: 04/29/15 11:57 PM  Result Value Ref Range Status   MRSA by PCR NEGATIVE NEGATIVE Final    Comment:        The GeneXpert MRSA Assay (FDA approved for NASAL specimens only), is one component  of a comprehensive MRSA colonization surveillance program. It is not intended to diagnose MRSA infection nor to guide or monitor treatment for MRSA infections.   Culture, blood (routine x 2)     Status: None (Preliminary result)   Collection Time: 04/30/15  1:05 AM  Result Value Ref Range Status   Specimen Description BLOOD RIGHT HAND  Final  Special Requests IN PEDIATRIC BOTTLE  Final   Culture NO GROWTH 3 DAYS  Final   Report Status PENDING  Incomplete  Culture, blood (routine x 2)     Status: None   Collection Time: 04/30/15  1:20 AM  Result Value Ref Range Status   Specimen Description BLOOD LEFT HAND  Final   Special Requests IN PEDIATRIC BOTTLE  Final   Culture  Setup Time   Final    AEROBIC BOTTLE ONLY GRAM POSITIVE COCCI IN PAIRS IN CLUSTERS CRITICAL RESULT CALLED TO, READ BACK BY AND VERIFIED WITH: A.ABSA,RN [id number] 05/01/15 M.CAMPBELL    Culture   Final    STAPHYLOCOCCUS SPECIES (COAGULASE NEGATIVE) THE SIGNIFICANCE OF ISOLATING THIS ORGANISM FROM A SINGLE SET OF BLOOD CULTURES WHEN MULTIPLE SETS ARE DRAWN IS UNCERTAIN. PLEASE NOTIFY THE MICROBIOLOGY DEPARTMENT WITHIN ONE WEEK IF SPECIATION AND SENSITIVITIES ARE REQUIRED.    Report Status 05/03/2015 FINAL  Final  Culture, blood (routine x 2)     Status: None (Preliminary result)   Collection Time: 05/01/15  2:15 PM  Result Value Ref Range Status   Specimen Description BLOOD RIGHT HAND  Final   Special Requests IN PEDIATRIC BOTTLE 2CC  Final   Culture NO GROWTH 2 DAYS  Final   Report Status PENDING  Incomplete  Culture, blood (routine x 2)     Status: None (Preliminary result)   Collection Time: 05/01/15  2:22 PM  Result Value Ref Range Status   Specimen Description BLOOD LEFT ARM  Final   Special Requests BOTTLES DRAWN AEROBIC AND ANAEROBIC 6CC  Final   Culture NO GROWTH 2 DAYS  Final   Report Status PENDING  Incomplete     Labs: Basic Metabolic Panel:  Recent Labs Lab 04/29/15 1944 04/30/15 0105  05/01/15 1850 05/02/15 0040 05/03/15 0454  NA 141 139 140 140 139  K 5.2* 5.5* 3.8 3.7 3.6  CL 100* 100* 98* 96* 96*  CO2 33* 30 34* 35* 33*  GLUCOSE 91 84 138* 127* 107*  BUN 56* 59* 47* 43* 41*  CREATININE 2.01* 2.14* 1.42* 1.29* 1.47*  CALCIUM 8.6* 8.4* 8.3* 8.3* 8.4*  MG  --  1.5*  --   --  1.3*  PHOS  --  3.9  --   --   --    Liver Function Tests:  Recent Labs Lab 04/29/15 1944  AST 33  ALT 30  ALKPHOS 107  BILITOT 1.5*  PROT 8.2*  ALBUMIN 2.0*    Recent Labs Lab 04/30/15 0105  LIPASE 16   No results for input(s): AMMONIA in the last 168 hours. CBC:  Recent Labs Lab 04/29/15 2004 04/30/15 0105 05/01/15 1415 05/02/15 0040  WBC 8.9 8.1 7.9 8.1  NEUTROABS 5.9  --  5.3  --   HGB 10.7* 11.7* 10.8* 10.8*  HCT 34.9* 37.0* 34.3* 33.8*  MCV 87.3 86.0 84.1 84.1  PLT 115* 102* 98* 94*   Cardiac Enzymes:  Recent Labs Lab 05/02/15 0040 05/02/15 0528 05/02/15 1526  TROPONINI 0.04* 0.03 0.04*   BNP: BNP (last 3 results)  Recent Labs  04/30/15 0105 05/03/15 0454  BNP 330.7* 209.8*    ProBNP (last 3 results) No results for input(s): PROBNP in the last 8760 hours.  CBG:  Recent Labs Lab 05/02/15 1328 05/02/15 1722 05/02/15 2210 05/03/15 0830 05/03/15 1237  GLUCAP 141* 168* 106* 115* 150*       Signed:  Deveron Shamoon MD  FACP  [facility] 05/03/2015, 3:13 PM

## 2015-05-03 NOTE — NC FL2 (Signed)
[place] MEDICAID [id number] LEVEL OF CARE SCREENING TOOL     IDENTIFICATION  Patient Name: Daniel Maynard Birthdate: Jan 22, 1956 Sex: male Admission Date (Current Location): 04/29/2015  [facility] and [place] Number:   Letitia Libra)   Facility and Address:  [facility], [address], [place], [place] [address]      Provider Number: [id number]  Attending Physician Name and Address:  Kathlen Mody, MD  Relative Name and Phone Number:       Current Level of Care: Hospital Recommended Level of Care: Skilled Nursing Facility Prior Approval Number:    Date Approved/Denied:   PASRR Number:    Discharge Plan: SNF    Current Diagnoses: Patient Active Problem List   Diagnosis Date Noted  . Abdominal discomfort   . Fluid overload   . Respiratory failure, acute and chronic (HCC) 05/01/2015  . Tracheostomy dependence (HCC) 05/01/2015  . Pneumonia   . Encephalopathy acute 04/29/2015    Orientation RESPIRATION BLADDER Height & Weight       Tracheostomy, Vent Indwelling catheter  (170.2 cm) 338 lbs.  BEHAVIORAL SYMPTOMS/MOOD NEUROLOGICAL BOWEL NUTRITION STATUS      Continent Diet (carb modified)  AMBULATORY STATUS COMMUNICATION OF NEEDS Skin   Extensive Assist   Normal                       Personal Care Assistance Level of Assistance  Bathing, Dressing, Feeding Bathing Assistance: Maximum assistance Feeding assistance: Limited assistance Dressing Assistance: Maximum assistance     Functional Limitations Info             SPECIAL CARE FACTORS FREQUENCY  PT (By licensed PT)     PT Frequency: 5/wk              Contractures      Additional Factors Info  Code Status, Allergies, Insulin Sliding Scale, Suctioning Needs Code Status Info: FULL Allergies Info: Hydrocodone, Morphine And Related, Zosyn   Insulin Sliding Scale Info: 3/day       Current Medications (05/03/2015):  This is the current hospital active medication list Current  Facility-Administered Medications  Medication Dose Route Frequency Provider Last Rate Last Dose  . 0.9 %  sodium chloride infusion  250 mL Intravenous PRN Cleone Slim, MD      . acetaminophen (TYLENOL) tablet 325 mg  325 mg Oral Q6H PRN Cleone Slim, MD   325 mg at 05/01/15 1850  . alum & mag hydroxide-simeth (MAALOX/MYLANTA) 200-200-20 MG/5ML suspension 30 mL  30 mL Oral Q6H PRN Kathlen Mody, MD      . bisacodyl (DULCOLAX) suppository 10 mg  10 mg Rectal PRN Cleone Slim, MD      . budesonide (PULMICORT) nebulizer solution 1 mg  1 mg Nebulization BID Cleone Slim, MD   1 mg at 05/03/15 0841  . carvedilol (COREG) tablet 25 mg  25 mg Oral BID WC Cleone Slim, MD   25 mg at 05/03/15 8657  . chlorhexidine (PERIDEX) 0.12 % solution 15 mL  15 mL Mouth/Throat BID Cleone Slim, MD   15 mL at 05/03/15 0926  . dronedarone (MULTAQ) tablet 400 mg  400 mg Oral BID WC Cleone Slim, MD   400 mg at 05/03/15 8469  . famotidine (PEPCID) tablet 20 mg  20 mg Oral QHS Herby Abraham, RPH   20 mg at 05/02/15 2257  . febuxostat (ULORIC) tablet 40 mg  40 mg Oral Daily  Cleone Slimaniel Lo Verde, MD   40 mg at 05/03/15 [id number]  . insulin aspart (novoLOG) injection 2-6 Units  2-6 Units Subcutaneous TID WC Alyson ReedyWesam G Yacoub, MD   2 Units at 05/03/15 1259  . ipratropium-albuterol (DUONEB) 0.5-2.5 (3) MG/3ML nebulizer solution 3 mL  3 mL Nebulization Q6H Cleone Slimaniel Lo Verde, MD   3 mL at 05/03/15 1328  . potassium chloride SA (K-DUR,KLOR-CON) CR tablet 20 mEq  20 mEq Oral Daily Kathlen ModyVijaya Akula, MD   20 mEq at 05/03/15 [id number]     Discharge Medications: Please see discharge summary for a list of discharge medications.  Relevant Imaging Results:  Relevant Lab Results:   Additional Information SS#: [id number]  Izora RibasHoloman, Dalayah Deahl M, [place]

## 2015-05-03 NOTE — Progress Notes (Signed)
Patient will discharge to [facility] Anticipated discharge date: 12/27 Transportation by [facility]- called at 3:35pm  CSW signing off.  Merlyn LotJenna Holoman, LCSWA Clinical Social Worker [phone]

## 2015-05-04 LAB — HEMOGLOBIN A1C
HEMOGLOBIN A1C: 6.9 % — AB (ref 4.8–5.6)
Mean Plasma Glucose: 151 mg/dL

## 2015-05-05 LAB — CULTURE, BLOOD (ROUTINE X 2): CULTURE: NO GROWTH

## 2015-05-06 LAB — CULTURE, BLOOD (ROUTINE X 2)
CULTURE: NO GROWTH
Culture: NO GROWTH

## 2015-05-11 ENCOUNTER — Encounter (HOSPITAL_COMMUNITY): Payer: Self-pay | Admitting: Emergency Medicine

## 2015-05-11 ENCOUNTER — Inpatient Hospital Stay (HOSPITAL_COMMUNITY)
Admission: EM | Admit: 2015-05-11 | Discharge: 2015-06-08 | DRG: 870 | Disposition: E | Payer: Medicare Other | Source: Ambulatory Visit | Attending: Pulmonary Disease | Admitting: Pulmonary Disease

## 2015-05-11 ENCOUNTER — Emergency Department (HOSPITAL_COMMUNITY): Payer: Medicare Other

## 2015-05-11 DIAGNOSIS — Z79899 Other long term (current) drug therapy: Secondary | ICD-10-CM

## 2015-05-11 DIAGNOSIS — Z881 Allergy status to other antibiotic agents status: Secondary | ICD-10-CM

## 2015-05-11 DIAGNOSIS — Z885 Allergy status to narcotic agent status: Secondary | ICD-10-CM

## 2015-05-11 DIAGNOSIS — J9622 Acute and chronic respiratory failure with hypercapnia: Secondary | ICD-10-CM | POA: Diagnosis not present

## 2015-05-11 DIAGNOSIS — R140 Abdominal distension (gaseous): Secondary | ICD-10-CM | POA: Diagnosis not present

## 2015-05-11 DIAGNOSIS — I4891 Unspecified atrial fibrillation: Secondary | ICD-10-CM | POA: Diagnosis present

## 2015-05-11 DIAGNOSIS — R6521 Severe sepsis with septic shock: Secondary | ICD-10-CM | POA: Diagnosis not present

## 2015-05-11 DIAGNOSIS — E861 Hypovolemia: Secondary | ICD-10-CM | POA: Diagnosis not present

## 2015-05-11 DIAGNOSIS — J189 Pneumonia, unspecified organism: Secondary | ICD-10-CM | POA: Diagnosis present

## 2015-05-11 DIAGNOSIS — N179 Acute kidney failure, unspecified: Secondary | ICD-10-CM

## 2015-05-11 DIAGNOSIS — Z7951 Long term (current) use of inhaled steroids: Secondary | ICD-10-CM

## 2015-05-11 DIAGNOSIS — I130 Hypertensive heart and chronic kidney disease with heart failure and stage 1 through stage 4 chronic kidney disease, or unspecified chronic kidney disease: Secondary | ICD-10-CM | POA: Diagnosis present

## 2015-05-11 DIAGNOSIS — Z930 Tracheostomy status: Secondary | ICD-10-CM | POA: Diagnosis not present

## 2015-05-11 DIAGNOSIS — G9340 Encephalopathy, unspecified: Secondary | ICD-10-CM | POA: Diagnosis present

## 2015-05-11 DIAGNOSIS — N183 Chronic kidney disease, stage 3 (moderate): Secondary | ICD-10-CM | POA: Diagnosis not present

## 2015-05-11 DIAGNOSIS — N390 Urinary tract infection, site not specified: Secondary | ICD-10-CM | POA: Diagnosis present

## 2015-05-11 DIAGNOSIS — Z452 Encounter for adjustment and management of vascular access device: Secondary | ICD-10-CM | POA: Diagnosis not present

## 2015-05-11 DIAGNOSIS — Z6841 Body Mass Index (BMI) 40.0 and over, adult: Secondary | ICD-10-CM | POA: Diagnosis not present

## 2015-05-11 DIAGNOSIS — A419 Sepsis, unspecified organism: Secondary | ICD-10-CM | POA: Diagnosis present

## 2015-05-11 DIAGNOSIS — N5089 Other specified disorders of the male genital organs: Secondary | ICD-10-CM

## 2015-05-11 DIAGNOSIS — R4020 Unspecified coma: Secondary | ICD-10-CM | POA: Diagnosis not present

## 2015-05-11 DIAGNOSIS — Z515 Encounter for palliative care: Secondary | ICD-10-CM | POA: Diagnosis not present

## 2015-05-11 DIAGNOSIS — E1122 Type 2 diabetes mellitus with diabetic chronic kidney disease: Secondary | ICD-10-CM | POA: Diagnosis present

## 2015-05-11 DIAGNOSIS — L732 Hidradenitis suppurativa: Secondary | ICD-10-CM | POA: Diagnosis not present

## 2015-05-11 DIAGNOSIS — Z794 Long term (current) use of insulin: Secondary | ICD-10-CM | POA: Diagnosis not present

## 2015-05-11 DIAGNOSIS — Z950 Presence of cardiac pacemaker: Secondary | ICD-10-CM | POA: Diagnosis not present

## 2015-05-11 DIAGNOSIS — I9589 Other hypotension: Secondary | ICD-10-CM | POA: Diagnosis not present

## 2015-05-11 DIAGNOSIS — E2740 Unspecified adrenocortical insufficiency: Secondary | ICD-10-CM | POA: Diagnosis not present

## 2015-05-11 DIAGNOSIS — R197 Diarrhea, unspecified: Secondary | ICD-10-CM | POA: Diagnosis not present

## 2015-05-11 DIAGNOSIS — E872 Acidosis: Secondary | ICD-10-CM | POA: Diagnosis not present

## 2015-05-11 DIAGNOSIS — I959 Hypotension, unspecified: Secondary | ICD-10-CM

## 2015-05-11 DIAGNOSIS — R34 Anuria and oliguria: Secondary | ICD-10-CM | POA: Diagnosis not present

## 2015-05-11 DIAGNOSIS — D72819 Decreased white blood cell count, unspecified: Secondary | ICD-10-CM | POA: Diagnosis not present

## 2015-05-11 DIAGNOSIS — I4892 Unspecified atrial flutter: Secondary | ICD-10-CM | POA: Diagnosis not present

## 2015-05-11 DIAGNOSIS — D61818 Other pancytopenia: Secondary | ICD-10-CM | POA: Diagnosis not present

## 2015-05-11 DIAGNOSIS — Z7982 Long term (current) use of aspirin: Secondary | ICD-10-CM

## 2015-05-11 DIAGNOSIS — J9621 Acute and chronic respiratory failure with hypoxia: Secondary | ICD-10-CM | POA: Insufficient documentation

## 2015-05-11 DIAGNOSIS — I5022 Chronic systolic (congestive) heart failure: Secondary | ICD-10-CM | POA: Diagnosis present

## 2015-05-11 DIAGNOSIS — E662 Morbid (severe) obesity with alveolar hypoventilation: Secondary | ICD-10-CM | POA: Diagnosis present

## 2015-05-11 DIAGNOSIS — J810 Acute pulmonary edema: Secondary | ICD-10-CM | POA: Diagnosis not present

## 2015-05-11 DIAGNOSIS — R6520 Severe sepsis without septic shock: Secondary | ICD-10-CM | POA: Diagnosis not present

## 2015-05-11 DIAGNOSIS — Y95 Nosocomial condition: Secondary | ICD-10-CM | POA: Diagnosis not present

## 2015-05-11 DIAGNOSIS — N170 Acute kidney failure with tubular necrosis: Secondary | ICD-10-CM | POA: Diagnosis present

## 2015-05-11 DIAGNOSIS — R509 Fever, unspecified: Secondary | ICD-10-CM

## 2015-05-11 DIAGNOSIS — Z9911 Dependence on respirator [ventilator] status: Secondary | ICD-10-CM | POA: Diagnosis not present

## 2015-05-11 DIAGNOSIS — D631 Anemia in chronic kidney disease: Secondary | ICD-10-CM | POA: Diagnosis present

## 2015-05-11 DIAGNOSIS — J9811 Atelectasis: Secondary | ICD-10-CM | POA: Diagnosis not present

## 2015-05-11 HISTORY — DX: Chronic respiratory failure with hypercapnia: J96.12

## 2015-05-11 LAB — CBC WITH DIFFERENTIAL/PLATELET
Basophils Absolute: 0.1 10*3/uL (ref 0.0–0.1)
Basophils Relative: 1 %
EOS PCT: 0 %
Eosinophils Absolute: 0 10*3/uL (ref 0.0–0.7)
HEMATOCRIT: 34.5 % — AB (ref 39.0–52.0)
HEMOGLOBIN: 10.9 g/dL — AB (ref 13.0–17.0)
LYMPHS ABS: 1.5 10*3/uL (ref 0.7–4.0)
Lymphocytes Relative: 19 %
MCH: 27 pg (ref 26.0–34.0)
MCHC: 31.6 g/dL (ref 30.0–36.0)
MCV: 85.6 fL (ref 78.0–100.0)
MONOS PCT: 3 %
Monocytes Absolute: 0.2 10*3/uL (ref 0.1–1.0)
Neutro Abs: 6.1 10*3/uL (ref 1.7–7.7)
Neutrophils Relative %: 77 %
Platelets: 130 10*3/uL — ABNORMAL LOW (ref 150–400)
RBC: 4.03 MIL/uL — ABNORMAL LOW (ref 4.22–5.81)
RDW: 23.4 % — AB (ref 11.5–15.5)
WBC: 7.9 10*3/uL (ref 4.0–10.5)

## 2015-05-11 LAB — I-STAT ARTERIAL BLOOD GAS, ED
BICARBONATE: 24.2 meq/L — AB (ref 20.0–24.0)
O2 SAT: 83 %
PCO2 ART: 41.3 mmHg (ref 35.0–45.0)
Patient temperature: 101.9
TCO2: 25 mmol/L (ref 0–100)
pH, Arterial: 7.384 (ref 7.350–7.450)
pO2, Arterial: 53 mmHg — ABNORMAL LOW (ref 80.0–100.0)

## 2015-05-11 LAB — URINE MICROSCOPIC-ADD ON

## 2015-05-11 LAB — BRAIN NATRIURETIC PEPTIDE: B Natriuretic Peptide: 217.7 pg/mL — ABNORMAL HIGH (ref 0.0–100.0)

## 2015-05-11 LAB — URINALYSIS, ROUTINE W REFLEX MICROSCOPIC
GLUCOSE, UA: NEGATIVE mg/dL
Ketones, ur: 15 mg/dL — AB
Nitrite: POSITIVE — AB
Protein, ur: 30 mg/dL — AB
SPECIFIC GRAVITY, URINE: 1.024 (ref 1.005–1.030)
pH: 5 (ref 5.0–8.0)

## 2015-05-11 LAB — COMPREHENSIVE METABOLIC PANEL
ALBUMIN: 1.6 g/dL — AB (ref 3.5–5.0)
ALT: 30 U/L (ref 17–63)
AST: 55 U/L — AB (ref 15–41)
Alkaline Phosphatase: 118 U/L (ref 38–126)
Anion gap: 14 (ref 5–15)
BUN: 92 mg/dL — AB (ref 6–20)
CHLORIDE: 100 mmol/L — AB (ref 101–111)
CO2: 24 mmol/L (ref 22–32)
Calcium: 7.2 mg/dL — ABNORMAL LOW (ref 8.9–10.3)
Creatinine, Ser: 4.58 mg/dL — ABNORMAL HIGH (ref 0.61–1.24)
GFR calc Af Amer: 15 mL/min — ABNORMAL LOW (ref >60)
GFR calc non Af Amer: 13 mL/min — ABNORMAL LOW (ref >60)
GLUCOSE: 97 mg/dL (ref 65–99)
POTASSIUM: 4.5 mmol/L (ref 3.5–5.1)
Sodium: 138 mmol/L (ref 135–145)
Total Bilirubin: 1.1 mg/dL (ref 0.3–1.2)
Total Protein: 6.7 g/dL (ref 6.5–8.1)

## 2015-05-11 LAB — I-STAT CG4 LACTIC ACID, ED: LACTIC ACID, VENOUS: 2.65 mmol/L — AB (ref 0.5–2.0)

## 2015-05-11 MED ORDER — FAMOTIDINE IN NACL 20-0.9 MG/50ML-% IV SOLN
20.0000 mg | INTRAVENOUS | Status: DC
  Administered 2015-05-12 – 2015-05-14 (×3): 20 mg via INTRAVENOUS
  Filled 2015-05-11 (×4): qty 50

## 2015-05-11 MED ORDER — SODIUM CHLORIDE 0.9 % IV SOLN
INTRAVENOUS | Status: DC
  Administered 2015-05-12: 02:00:00 via INTRAVENOUS

## 2015-05-11 MED ORDER — BUDESONIDE 0.5 MG/2ML IN SUSP
0.5000 mg | Freq: Two times a day (BID) | RESPIRATORY_TRACT | Status: DC
  Administered 2015-05-12 – 2015-05-16 (×9): 0.5 mg via RESPIRATORY_TRACT
  Filled 2015-05-11 (×14): qty 2

## 2015-05-11 MED ORDER — LEVOFLOXACIN IN D5W 750 MG/150ML IV SOLN
750.0000 mg | Freq: Once | INTRAVENOUS | Status: AC
  Administered 2015-05-11: 750 mg via INTRAVENOUS
  Filled 2015-05-11: qty 150

## 2015-05-11 MED ORDER — ACETAMINOPHEN 650 MG RE SUPP
650.0000 mg | Freq: Once | RECTAL | Status: AC
  Administered 2015-05-11: 650 mg via RECTAL

## 2015-05-11 MED ORDER — IPRATROPIUM-ALBUTEROL 0.5-2.5 (3) MG/3ML IN SOLN
3.0000 mL | Freq: Four times a day (QID) | RESPIRATORY_TRACT | Status: DC
  Administered 2015-05-12 – 2015-05-16 (×19): 3 mL via RESPIRATORY_TRACT
  Filled 2015-05-11 (×19): qty 3

## 2015-05-11 MED ORDER — INSULIN ASPART 100 UNIT/ML ~~LOC~~ SOLN
0.0000 [IU] | SUBCUTANEOUS | Status: DC
  Administered 2015-05-12: 3 [IU] via SUBCUTANEOUS
  Administered 2015-05-12 (×2): 4 [IU] via SUBCUTANEOUS
  Administered 2015-05-13 – 2015-05-14 (×10): 3 [IU] via SUBCUTANEOUS
  Administered 2015-05-15: 11 [IU] via SUBCUTANEOUS
  Administered 2015-05-15: 15 [IU] via SUBCUTANEOUS
  Administered 2015-05-15: 7 [IU] via SUBCUTANEOUS
  Administered 2015-05-15: 3 [IU] via SUBCUTANEOUS
  Administered 2015-05-15 (×2): 4 [IU] via SUBCUTANEOUS
  Administered 2015-05-16: 11 [IU] via SUBCUTANEOUS

## 2015-05-11 MED ORDER — DEXTROSE 5 % IV SOLN
2.0000 g | Freq: Once | INTRAVENOUS | Status: AC
  Administered 2015-05-11: 2 g via INTRAVENOUS
  Filled 2015-05-11: qty 2

## 2015-05-11 MED ORDER — SODIUM CHLORIDE 0.9 % IV BOLUS (SEPSIS)
500.0000 mL | Freq: Once | INTRAVENOUS | Status: AC
  Administered 2015-05-11: 500 mL via INTRAVENOUS

## 2015-05-11 MED ORDER — SODIUM CHLORIDE 0.9 % IV BOLUS (SEPSIS)
1000.0000 mL | Freq: Once | INTRAVENOUS | Status: AC
  Administered 2015-05-11: 1000 mL via INTRAVENOUS

## 2015-05-11 MED ORDER — PANTOPRAZOLE SODIUM 40 MG IV SOLR: 40.0000 mg | Freq: Every day | INTRAVENOUS | Status: DC

## 2015-05-11 MED ORDER — SODIUM CHLORIDE 0.9 % IV BOLUS (SEPSIS)
2000.0000 mL | Freq: Once | INTRAVENOUS | Status: AC
  Administered 2015-05-11: 2000 mL via INTRAVENOUS

## 2015-05-11 MED ORDER — VANCOMYCIN HCL 10 G IV SOLR
2500.0000 mg | Freq: Once | INTRAVENOUS | Status: AC
  Administered 2015-05-11: 2500 mg via INTRAVENOUS
  Filled 2015-05-11: qty 2500

## 2015-05-11 MED ORDER — SODIUM CHLORIDE 0.9 % IV BOLUS (SEPSIS): 1000.0000 mL | Freq: Once | INTRAVENOUS | Status: DC

## 2015-05-11 MED ORDER — HEPARIN SODIUM (PORCINE) 5000 UNIT/ML IJ SOLN
5000.0000 [IU] | Freq: Three times a day (TID) | INTRAMUSCULAR | Status: DC
  Administered 2015-05-12: 5000 [IU] via SUBCUTANEOUS
  Filled 2015-05-11 (×4): qty 1

## 2015-05-11 MED ORDER — ACETAMINOPHEN 650 MG RE SUPP
650.0000 mg | Freq: Once | RECTAL | Status: AC
  Administered 2015-05-11: 650 mg via RECTAL
  Filled 2015-05-11: qty 1

## 2015-05-11 MED ORDER — NOREPINEPHRINE BITARTRATE 1 MG/ML IV SOLN
0.0000 ug/min | Freq: Once | INTRAVENOUS | Status: AC
  Administered 2015-05-11: 5 ug/min via INTRAVENOUS
  Filled 2015-05-11: qty 4

## 2015-05-11 MED ORDER — SODIUM CHLORIDE 0.9 % IV BOLUS (SEPSIS): 2500.0000 mL | Freq: Once | INTRAVENOUS | Status: DC

## 2015-05-11 MED ORDER — SODIUM CHLORIDE 0.9 % IV SOLN
250.0000 mL | INTRAVENOUS | Status: DC | PRN
  Administered 2015-05-12: 250 mL via INTRAVENOUS

## 2015-05-11 NOTE — ED Provider Notes (Signed)
CSN: [id number]     Arrival date & time 05/29/2015  2034 History   First MD Initiated Contact with Patient 05/10/2015 2047     Chief Complaint  Patient presents with  . Hypotension  . Fever     (Consider location/radiation/quality/duration/timing/severity/associated sxs/prior Treatment) Patient is a 60 y.o. male presenting with fever. The history is provided by the EMS personnel.  Fever Temp source:  Subjective Severity:  Moderate Onset quality:  Gradual Duration:  1 day Timing:  Constant Progression:  Worsening Chronicity:  Recurrent Relieved by:  None tried Worsened by:  Nothing tried Ineffective treatments:  None tried Associated symptoms: no cough, no diarrhea, no rash, no somnolence and no vomiting     Past Medical History  Diagnosis Date  . Hypertension   . CHF (congestive heart failure) (HCC)   . Renal disorder   . Diabetes mellitus without complication (HCC)   . Sleep apnea    Past Surgical History  Procedure Laterality Date  . Tracheostomy     No family history on file. Social History  Substance Use Topics  . Smoking status: Unknown If Ever Smoked  . Smokeless tobacco: None  . Alcohol Use: None    Review of Systems  Unable to perform ROS: Intubated  Constitutional: Positive for fever.  Respiratory: Negative for cough.   Gastrointestinal: Negative for vomiting and diarrhea.  Skin: Negative for rash.      Allergies  Hydrocodone; Morphine and related; and Zosyn  Home Medications   Prior to Admission medications   Medication Sig Start Date End Date Taking? Authorizing Provider  acetaminophen (TYLENOL) 325 MG tablet Take 325 mg by mouth every 6 (six) hours as needed for mild pain or moderate pain.    Yes Historical Provider, MD  amLODipine (NORVASC) 10 MG tablet Take 10 mg by mouth daily.   Yes Historical Provider, MD  aspirin EC 81 MG tablet Take 81 mg by mouth daily.   Yes Historical Provider, MD  bisacodyl (DULCOLAX) 10 MG suppository Place 10 mg  rectally as needed for mild constipation or moderate constipation.   Yes Historical Provider, MD  budesonide (PULMICORT) 0.5 MG/2ML nebulizer solution Take 0.5 mg by nebulization 2 (two) times daily.   Yes Historical Provider, MD  carvedilol (COREG) 25 MG tablet Take 25 mg by mouth 2 (two) times daily. 03/21/15  Yes Historical Provider, MD  chlorhexidine (PERIDEX) 0.12 % solution Use as directed 15 mLs in the mouth or throat 2 (two) times daily.   Yes Historical Provider, MD  diclofenac sodium (VOLTAREN) 1 % GEL Apply 1 application topically 4 (four) times daily.   Yes Historical Provider, MD  dronedarone (MULTAQ) 400 MG tablet Take 400 mg by mouth 2 (two) times daily with a meal.   Yes Historical Provider, MD  famotidine (PEPCID) 20 MG tablet Take 20 mg by mouth at bedtime. 03/21/15  Yes Historical Provider, MD  febuxostat (ULORIC) 40 MG tablet Take 40 mg by mouth daily.   Yes Historical Provider, MD  gabapentin (NEURONTIN) 100 MG capsule Take 200 mg by mouth 2 (two) times daily.   Yes Historical Provider, MD  insulin aspart (NOVOLOG) 100 UNIT/ML injection Inject 3-20 Units into the skin 3 (three) times daily with meals as needed for high blood sugar (151-200 = 3 units, 201-300 = 5 units, 301-400 = 10 units, 401-500 = 15 units, 501+ = 20 units).    Yes Historical Provider, MD  ipratropium-albuterol (DUONEB) 0.5-2.5 (3) MG/3ML SOLN Take 3 mLs by nebulization every  6 (six) hours.   Yes Historical Provider, MD  torsemide (DEMADEX) 10 MG tablet Take 2 tablets (20 mg total) by mouth daily. 05/03/15  Yes Kathlen Mody, MD  colchicine 0.6 MG tablet Take 0.6 mg by mouth 2 (two) times daily.    Historical Provider, MD   BP 105/55 mmHg  Pulse 78  Temp(Src) 101.9 F (38.8 C) (Rectal)  Resp 20  SpO2 97% Physical Exam  Constitutional: He appears well-developed and well-nourished. He has a sickly appearance. He appears ill.  HENT:  Head: Head is without abrasion and without contusion.  Mouth/Throat: Mucous  membranes are dry.  Eyes: Conjunctivae and EOM are normal. Pupils are equal, round, and reactive to light.  Neck:    Cardiovascular: Regular rhythm.  Tachycardia present.  Exam reveals no decreased pulses.   No murmur heard. Pulmonary/Chest: No respiratory distress. He has rhonchi in the right upper field, the right middle field, the right lower field, the left upper field, the left middle field and the left lower field.  Abdominal: Normal appearance. He exhibits no distension. There is no rigidity, no rebound and no CVA tenderness.  Genitourinary: Right testis shows swelling. Left testis shows swelling.  Neurological: He is alert. GCS eye subscore is 4. GCS verbal subscore is 1. GCS motor subscore is 6.  Skin: No rash noted.    ED Course  .Critical Care Performed by: Stacy Gardner Authorized by: Stacy Gardner Total critical care time: 40 minutes Critical care was necessary to treat or prevent imminent or life-threatening deterioration of the following conditions: sepsis. Critical care was time spent personally by me on the following activities: blood draw for specimens, development of treatment plan with patient or surrogate, discussions with consultants, evaluation of patient's response to treatment, examination of patient, obtaining history from patient or surrogate, ordering and performing treatments and interventions, ordering and review of laboratory studies, ordering and review of radiographic studies, pulse oximetry, re-evaluation of patient's condition, review of old charts and ventilator management.   (including critical care time) Labs Review Labs Reviewed  COMPREHENSIVE METABOLIC PANEL - Abnormal; Notable for the following:    Chloride 100 (*)    BUN 92 (*)    Creatinine, Ser 4.58 (*)    Calcium 7.2 (*)    Albumin 1.6 (*)    AST 55 (*)    GFR calc non Af Amer 13 (*)    GFR calc Af Amer 15 (*)    All other components within normal limits  URINALYSIS, ROUTINE W REFLEX  MICROSCOPIC (NOT AT [facility]) - Abnormal; Notable for the following:    Color, Urine AMBER (*)    APPearance TURBID (*)    Hgb urine dipstick SMALL (*)    Bilirubin Urine MODERATE (*)    Ketones, ur 15 (*)    Protein, ur 30 (*)    Nitrite POSITIVE (*)    Leukocytes, UA SMALL (*)    All other components within normal limits  CBC WITH DIFFERENTIAL/PLATELET - Abnormal; Notable for the following:    RBC 4.03 (*)    Hemoglobin 10.9 (*)    HCT 34.5 (*)    RDW 23.4 (*)    Platelets 130 (*)    All other components within normal limits  BRAIN NATRIURETIC PEPTIDE - Abnormal; Notable for the following:    B Natriuretic Peptide 217.7 (*)    All other components within normal limits  URINE MICROSCOPIC-ADD ON - Abnormal; Notable for the following:    Squamous Epithelial / LPF 0-5 (*)  Bacteria, UA MANY (*)    Casts GRANULAR CAST (*)    Crystals CA OXALATE CRYSTALS (*)    All other components within normal limits  LACTIC ACID, PLASMA - Abnormal; Notable for the following:    Lactic Acid, Venous 2.1 (*)    All other components within normal limits  TROPONIN I - Abnormal; Notable for the following:    Troponin I 0.08 (*)    All other components within normal limits  I-STAT CG4 LACTIC ACID, ED - Abnormal; Notable for the following:    Lactic Acid, Venous 2.65 (*)    All other components within normal limits  I-STAT ARTERIAL BLOOD GAS, ED - Abnormal; Notable for the following:    pO2, Arterial 53.0 (*)    Bicarbonate 24.2 (*)    All other components within normal limits  CBG MONITORING, ED - Abnormal; Notable for the following:    Glucose-Capillary 114 (*)    All other components within normal limits  CULTURE, BLOOD (ROUTINE X 2)  CULTURE, BLOOD (ROUTINE X 2)  URINE CULTURE  MRSA PCR SCREENING  CORTISOL  CBC  BASIC METABOLIC PANEL  MAGNESIUM  PHOSPHORUS    Imaging Review Dg Chest Port 1 View  06/05/2015  CLINICAL DATA:  Fever and lethargy for 1 day EXAM: PORTABLE CHEST 1 VIEW  COMPARISON:  May 02, 2015 FINDINGS: Tracheostomy catheter tip is 3.0 cm above the carina. Pacemaker leads are attached to the right atrium right ventricle. No pneumothorax. There is patchy infiltrate in the left mid lung, right mid lung, and right base regions. Lungs elsewhere clear. Heart is mildly enlarged with pulmonary vascularity within normal limits. No adenopathy. No bone lesions. IMPRESSION: Patchy infiltrate in the left mid lung, right mid lung and right base regions, consistent with multifocal pneumonia. Stable cardiac prominence. Tracheostomy catheter as described without pneumothorax. Electronically Signed   By: Bretta Bang III M.D.   On: 05/19/2015 21:46   I have personally reviewed and evaluated these images and lab results as part of my medical decision-making.   EKG Interpretation None      MDM   Final diagnoses:  HCAP (healthcare-associated pneumonia)  Sepsis, due to unspecified organism (HCC)  Hypotension, unspecified hypotension type  AKI (acute kidney injury) (HCC)    60 year old gentleman with past medical history of chronic respiratory failure on vent via tracheostomy, CHF, diabetes who presents from nursing facility in setting of abnormal laboratory finding. Per EMS patient had labs obtained today which revealed creatinine in the 4's. Patient has baseline creatinine at 1.5-2. On arrival patient found to be febrile to 11.9 with hypertension and tachycardia. Due to these findings could sepsis was immediately called. Due to underlying CHF patient was not given 30 mL per KG of fluid immediately. Patient was given additional fluid bolus to the 1 L that the patient had received via EMS. Patient has indwelling Foley catheter and history of recurrent lung infections and I believe patient's sepsis is likely secondary to lung versus urine infection. We'll reassess patient's hypotension after fluid challenge.   Patient found to have lactic acidosis. Additionally patient  found to have acute kidney injury with creatinine of 4.58. Patient with continued anemia and elevation in BNP. Urinalysis consistent with possible urinary tract infection. Chest x-ray concerning for multifocal multifocal pneumonia. In setting these findings patient was given antibiotics to cover possible pneumonia and urinary tract infection. Patient had ABG obtained without significant acidosis. Patient did have blood cultures obtained prior to IV antibiotic administration. Due to  continued hypotension after fluid challenge patient was started on levophed.  Due to sepsis and chronic vent dependence critical care team was contacted for admission. I discussed case with critical care team patient had EKG obtained further recommendations which did not reveal acute ischemia. Troponin which was obtained which had slight elevation. Setting of sepsis this could be demand ischemia. Patient given additional IV fluids due to critical care recommendations. Patient stable at time of admission to critical care.  Attending has seen and evaluated patient and Dr. Clarene Duke is in agreement with plan.    Stacy Gardner, MD 05/12/15 0128  Laurence Spates, MD 05/23/15 2139

## 2015-05-11 NOTE — Progress Notes (Signed)
RT note: Back up 8.0 Shiley/Proximal arrived/placed @ bedside.

## 2015-05-11 NOTE — Progress Notes (Signed)
RT note: Ordered back up 8.0 cuffed Shiley/Proximal from [employer], RT to monitor.

## 2015-05-11 NOTE — H&P (Signed)
PULMONARY / CRITICAL CARE MEDICINE   Name: Daniel Maynard MRN: [id number] DOB: 1955-06-28    ADMISSION DATE:  26-May-2015 CONSULTATION DATE: 05-26-15  REFERRING MD : Dr. Clarene Duke  PRIMARY SERVICE: Critical Care   CHIEF COMPLAINT:  Low urine output, fever, lethargy   HISTORY OF PRESENT ILLNESS:  Patient is  Non-verbal and history was obtained from chart.   He is a 60 year old male with a past medical history of diabetes,  CHF, CKD, chronic respiratory failure with tracheostomy (nighttime ventilator use and daytime trach collar) was transferred to [facility] from [facility] for low urine output, fever, and lethargy that started today.  Patient was recently admitted to [facility] on 04/29/2015 for acute respiratory failure with hypoxia and hypercarbia and fluid overload. He was also found to have acute encephalopathy likely from hypercarbia and respiratory acidosis which according to the discharge summary resolved prior to discharge.     PAST MEDICAL HISTORY :  Past Medical History  Diagnosis Date  . Hypertension   . CHF (congestive heart failure) (HCC)   . Renal disorder   . Diabetes mellitus without complication (HCC)   . Sleep apnea    Past Surgical History  Procedure Laterality Date  . Tracheostomy     Prior to Admission medications   Medication Sig Start Date End Date Taking? Authorizing Provider  acetaminophen (TYLENOL) 325 MG tablet Take 325 mg by mouth every 6 (six) hours as needed for mild pain or moderate pain.    Yes Historical Provider, MD  amLODipine (NORVASC) 10 MG tablet Take 10 mg by mouth daily.   Yes Historical Provider, MD  aspirin EC 81 MG tablet Take 81 mg by mouth daily.   Yes Historical Provider, MD  bisacodyl (DULCOLAX) 10 MG suppository Place 10 mg rectally as needed for mild constipation or moderate constipation.   Yes Historical Provider, MD  budesonide (PULMICORT) 0.5 MG/2ML nebulizer solution Take 0.5 mg by nebulization 2 (two) times daily.    Yes Historical Provider, MD  carvedilol (COREG) 25 MG tablet Take 25 mg by mouth 2 (two) times daily. 03/21/15  Yes Historical Provider, MD  chlorhexidine (PERIDEX) 0.12 % solution Use as directed 15 mLs in the mouth or throat 2 (two) times daily.   Yes Historical Provider, MD  diclofenac sodium (VOLTAREN) 1 % GEL Apply 1 application topically 4 (four) times daily.   Yes Historical Provider, MD  dronedarone (MULTAQ) 400 MG tablet Take 400 mg by mouth 2 (two) times daily with a meal.   Yes Historical Provider, MD  famotidine (PEPCID) 20 MG tablet Take 20 mg by mouth at bedtime. 03/21/15  Yes Historical Provider, MD  febuxostat (ULORIC) 40 MG tablet Take 40 mg by mouth daily.   Yes Historical Provider, MD  gabapentin (NEURONTIN) 100 MG capsule Take 200 mg by mouth 2 (two) times daily.   Yes Historical Provider, MD  insulin aspart (NOVOLOG) 100 UNIT/ML injection Inject 3-20 Units into the skin 3 (three) times daily with meals as needed for high blood sugar (151-200 = 3 units, 201-300 = 5 units, 301-400 = 10 units, 401-500 = 15 units, 501+ = 20 units).    Yes Historical Provider, MD  ipratropium-albuterol (DUONEB) 0.5-2.5 (3) MG/3ML SOLN Take 3 mLs by nebulization every 6 (six) hours.   Yes Historical Provider, MD  torsemide (DEMADEX) 10 MG tablet Take 2 tablets (20 mg total) by mouth daily. 05/03/15  Yes Kathlen Mody, MD  colchicine 0.6 MG tablet Take 0.6 mg by mouth  2 (two) times daily.    Historical Provider, MD   Allergies  Allergen Reactions  . Hydrocodone     Unknown  . Morphine And Related     unknown  . Zosyn [Piperacillin Sod-Tazobactam So]     Unknown    FAMILY HISTORY:  No family history on file. SOCIAL HISTORY:  has no tobacco, alcohol, and drug history on file.  REVIEW OF SYSTEMS:  Could not be obtained as patient is nonverbal.  SUBJECTIVE:   VITAL SIGNS: Temp:  [101.9 F (38.8 C)] 101.9 F (38.8 C) (01/04 2042) Pulse Rate:  [70-81] 75 (01/04 2330) Resp:  [17-26] 17  (01/04 2330) BP: (61-120)/(29-71) 120/62 mmHg (01/04 2330) SpO2:  [94 %-100 %] 97 % (01/04 2330) FiO2 (%):  [55 %] 55 % (01/04 2100) HEMODYNAMICS:   VENTILATOR SETTINGS: Vent Mode:  [-] PRVC FiO2 (%):  [55 %] 55 % Set Rate:  [20 bmp] 20 bmp Vt Set:  [500 mL] 500 mL PEEP:  [5 cmH20] 5 cmH20 Plateau Pressure:  [22 cmH20] 22 cmH20 INTAKE / OUTPUT: Intake/Output      01/04 0701 - 01/05 0700   I.V. 500   Total Intake 500   Urine 0   Total Output 0   Net +500         PHYSICAL EXAMINATION: General:  Obese male with a tracheostomy collar (on vent) Neuro:  Awake, alert and following verbal commands. He was able to squeeze my fingers on command. Patient is nonverbal and as such orientation could not be assessed.  HEENT: normocephalic and atraumatic. PERRL and EOMI. Dry mucous membranes.  Cardiovascular:  RRR. S1 and S2 appreciated. No murmurs, rubs, or gallops. Pulses intact in bilateral upper and lower extremities. Lungs:  Coarse breath sounds in the right anterior lung fields.  Abdomen: Soft. Positive bowel sounds. Non-tender to palpation.  Musculoskeletal:  +1 edema of bilateral lower extremities Skin: Foul smelling purulent drainage noted in right axilla.   LABS:  CBC  Recent Labs Lab 06/04/2015 2138  WBC 7.9  HGB 10.9*  HCT 34.5*  PLT 130*   Coag's No results for input(s): APTT, INR in the last 168 hours. BMET  Recent Labs Lab 05/24/2015 2138  NA 138  K 4.5  CL 100*  CO2 24  BUN 92*  CREATININE 4.58*  GLUCOSE 97   Electrolytes  Recent Labs Lab 05/18/2015 2138  CALCIUM 7.2*   Sepsis Markers  Recent Labs Lab 05/26/2015 2136  LATICACIDVEN 2.65*   ABG  Recent Labs Lab 05/26/2015 2049  PHART 7.384  PCO2ART 41.3  PO2ART 53.0*   Liver Enzymes  Recent Labs Lab 06/05/2015 2138  AST 55*  ALT 30  ALKPHOS 118  BILITOT 1.1  ALBUMIN 1.6*   Cardiac Enzymes No results for input(s): TROPONINI, PROBNP in the last 168 hours. Glucose No results for  input(s): GLUCAP in the last 168 hours.  Imaging Dg Chest Port 1 View  05/21/2015  CLINICAL DATA:  Fever and lethargy for 1 day EXAM: PORTABLE CHEST 1 VIEW COMPARISON:  May 02, 2015 FINDINGS: Tracheostomy catheter tip is 3.0 cm above the carina. Pacemaker leads are attached to the right atrium right ventricle. No pneumothorax. There is patchy infiltrate in the left mid lung, right mid lung, and right base regions. Lungs elsewhere clear. Heart is mildly enlarged with pulmonary vascularity within normal limits. No adenopathy. No bone lesions. IMPRESSION: Patchy infiltrate in the left mid lung, right mid lung and right base regions, consistent with multifocal pneumonia. Stable  cardiac prominence. Tracheostomy catheter as described without pneumothorax. Electronically Signed   By: Bretta Bang III M.D.   On: 01-Jun-2015 21:46     CXR: Patchy infiltrate in the left midlung, right midlung and right base regions, consistent with multifocal pneumonia.  ASSESSMENT / PLAN:  PULMONARY A: Acute on chronic hypoxic respiratory failure secondary to HCAP. P:   -continue ventilator support  -continue Vancomycin and Aztreonam. Pt already received Levaquin in the ED.   -DuoNeb every 6 hours -Pulmicort neb BID  CARDIOVASCULAR A: History of congestive heart failure, however, echo showing normal ejection fraction 65-70%. HTN P:  -Hold home medications Norvasc and Coreg in the setting of hypotension -Hold home medication Torsemide in the setting of hypotension and kidney injury   RENAL A:  ATN in the setting of severe sepsis (hypotension). Serum creatinine 4.58 on admission; baseline approximately 1.7. GFR reduced to 15; was 59 on 05/03/15.  CKD stage III P:   -Patient has already received 4.5 L of IV fluid boluses. Continue normal saline at 125 mL per hour.  -Continue to monitor renal function   GASTROINTESTINAL A:  GI prophylaxis P:    -IV Pepcid 20 mg daily  HEMATOLOGIC A:  Normocytic  anemia likely secondary to chronic kidney disease. Hemoglobin 10.9 normal MCV. DVT prophylaxis P:  -subcutaneous heparin 5000 units every 8 hours for DVT prophylaxis  INFECTIOUS A:  Severe sepsis secondary to HCAP vs UTI (UA positive for nitrite). Physical exam notable for foul smelling purulent drainage in the right axilla (possbile hidradenitis suppuritiva?). Patient was febrile (T 101.9) and hypotensive on admission with a blood pressure of 61/39.  P:   -Patient has already received 4.5 L of IV fluid boluses. Continue normal saline at 125 mL per hour.  -Levophed  -Vancomycin and Aztreonam  -Tylenol suppository as needed for fevers -pending urine culture -pending blood cultures  -Culture of drainage from R axilla   ENDOCRINE A:  History of DM 2. Most recent A1c 6.9. CBG 97 on admission. P:   -continue to monitor blood sugars -sliding scale insulin  NEUROLOGIC A:   P:     I have personally obtained a history, examined the patient, evaluated laboratory and imaging results, formulated the assessment and plan and placed orders. CRITICAL CARE: The patient is critically ill with multiple organ systems failure and requires high complexity decision making for assessment and support, frequent evaluation and titration of therapies, application of advanced monitoring technologies and extensive interpretation of multiple databases. Critical Care Time devoted to patient care services described in this note is minutes.    Pulmonary and Critical Care Medicine [facility] Pager: [phone]  2015-06-01, 11:39 PM

## 2015-05-11 NOTE — ED Notes (Signed)
[facility] , staff reported low urine output , fever and lethargy onset today , pt. has tracheostomy / vent dependent at night .

## 2015-05-11 NOTE — ED Notes (Signed)
Sent message to main pharm about sending Levophed STAT

## 2015-05-11 NOTE — Progress Notes (Signed)
Pharmacy Code Sepsis Protocol  Time of code sepsis page: 2046 Time of antibiotic delivery: 2114  Were antibiotics ordered at the time of the code sepsis page? No Was it required to contact the physician? []  Physician not contacted [x]  Physician contacted to order antibiotics for code sepsis []  Physician contacted to recommend changing antibiotics  Pharmacy consulted for: vancomycin, aztreonam and levaquin  Anti-infectives    None      @infusions @   Nurse education provided: [x]  Minutes left to administer antibiotics to achieve 1 hour goal [x]  Correct order of antibiotic administration [x]  Antibiotic Y-site compatibilities    Arlean Hoppingorey M. Newman PiesBall, PharmD, BCPS Clinical Pharmacist Pager [phone] 05/29/2015, 8:50 PM

## 2015-05-12 ENCOUNTER — Inpatient Hospital Stay (HOSPITAL_COMMUNITY): Payer: Medicare Other

## 2015-05-12 ENCOUNTER — Encounter (HOSPITAL_COMMUNITY): Payer: Self-pay | Admitting: General Surgery

## 2015-05-12 DIAGNOSIS — J189 Pneumonia, unspecified organism: Secondary | ICD-10-CM | POA: Insufficient documentation

## 2015-05-12 DIAGNOSIS — N390 Urinary tract infection, site not specified: Secondary | ICD-10-CM

## 2015-05-12 DIAGNOSIS — R6521 Severe sepsis with septic shock: Secondary | ICD-10-CM

## 2015-05-12 DIAGNOSIS — R6520 Severe sepsis without septic shock: Secondary | ICD-10-CM | POA: Diagnosis present

## 2015-05-12 DIAGNOSIS — R140 Abdominal distension (gaseous): Secondary | ICD-10-CM

## 2015-05-12 DIAGNOSIS — Z452 Encounter for adjustment and management of vascular access device: Secondary | ICD-10-CM

## 2015-05-12 DIAGNOSIS — A419 Sepsis, unspecified organism: Secondary | ICD-10-CM | POA: Insufficient documentation

## 2015-05-12 DIAGNOSIS — I959 Hypotension, unspecified: Secondary | ICD-10-CM

## 2015-05-12 DIAGNOSIS — N179 Acute kidney failure, unspecified: Secondary | ICD-10-CM | POA: Insufficient documentation

## 2015-05-12 LAB — PHOSPHORUS: PHOSPHORUS: 5.8 mg/dL — AB (ref 2.5–4.6)

## 2015-05-12 LAB — CORTISOL: Cortisol, Plasma: 20.1 ug/dL

## 2015-05-12 LAB — POCT I-STAT 3, ART BLOOD GAS (G3+)
Acid-base deficit: 5 mmol/L — ABNORMAL HIGH (ref 0.0–2.0)
Bicarbonate: 21.3 mEq/L (ref 20.0–24.0)
O2 Saturation: 92 %
PCO2 ART: 43.1 mmHg (ref 35.0–45.0)
PH ART: 7.303 — AB (ref 7.350–7.450)
Patient temperature: 99
TCO2: 23 mmol/L (ref 0–100)
pO2, Arterial: 71 mmHg — ABNORMAL LOW (ref 80.0–100.0)

## 2015-05-12 LAB — BASIC METABOLIC PANEL
ANION GAP: 14 (ref 5–15)
ANION GAP: 12 (ref 5–15)
ANION GAP: 15 (ref 5–15)
BUN: 89 mg/dL — ABNORMAL HIGH (ref 6–20)
BUN: 88 mg/dL — ABNORMAL HIGH (ref 6–20)
BUN: 89 mg/dL — AB (ref 6–20)
CALCIUM: 6.5 mg/dL — AB (ref 8.9–10.3)
CALCIUM: 6 mg/dL — AB (ref 8.9–10.3)
CALCIUM: 6.3 mg/dL — AB (ref 8.9–10.3)
CHLORIDE: 107 mmol/L (ref 101–111)
CO2: 21 mmol/L — AB (ref 22–32)
CO2: 19 mmol/L — AB (ref 22–32)
CO2: 18 mmol/L — ABNORMAL LOW (ref 22–32)
CREATININE: 4.18 mg/dL — AB (ref 0.61–1.24)
Chloride: 103 mmol/L (ref 101–111)
Chloride: 103 mmol/L (ref 101–111)
Creatinine, Ser: 3.65 mg/dL — ABNORMAL HIGH (ref 0.61–1.24)
Creatinine, Ser: 3.68 mg/dL — ABNORMAL HIGH (ref 0.61–1.24)
GFR calc Af Amer: 19 mL/min — ABNORMAL LOW (ref >60)
GFR calc non Af Amer: 17 mL/min — ABNORMAL LOW (ref >60)
GFR, EST AFRICAN AMERICAN: 17 mL/min — AB (ref >60)
GFR, EST AFRICAN AMERICAN: 20 mL/min — AB (ref >60)
GFR, EST NON AFRICAN AMERICAN: 14 mL/min — AB (ref >60)
GFR, EST NON AFRICAN AMERICAN: 17 mL/min — AB (ref >60)
GLUCOSE: 170 mg/dL — AB (ref 65–99)
Glucose, Bld: 129 mg/dL — ABNORMAL HIGH (ref 65–99)
Glucose, Bld: 145 mg/dL — ABNORMAL HIGH (ref 65–99)
POTASSIUM: 4.1 mmol/L (ref 3.5–5.1)
Potassium: 4.2 mmol/L (ref 3.5–5.1)
Potassium: 4.5 mmol/L (ref 3.5–5.1)
Sodium: 138 mmol/L (ref 135–145)
Sodium: 138 mmol/L (ref 135–145)
Sodium: 136 mmol/L (ref 135–145)

## 2015-05-12 LAB — LACTIC ACID, PLASMA: LACTIC ACID, VENOUS: 2.1 mmol/L — AB (ref 0.5–2.0)

## 2015-05-12 LAB — TROPONIN I
TROPONIN I: 0.08 ng/mL — AB (ref <0.031)
Troponin I: 0.08 ng/mL — ABNORMAL HIGH (ref <0.031)
Troponin I: 0.09 ng/mL — ABNORMAL HIGH (ref <0.031)
Troponin I: 0.09 ng/mL — ABNORMAL HIGH (ref <0.031)

## 2015-05-12 LAB — CBC
HCT: 34.1 % — ABNORMAL LOW (ref 39.0–52.0)
Hemoglobin: 10.9 g/dL — ABNORMAL LOW (ref 13.0–17.0)
MCH: 27.3 pg (ref 26.0–34.0)
MCHC: 32 g/dL (ref 30.0–36.0)
MCV: 85.3 fL (ref 78.0–100.0)
PLATELETS: 118 10*3/uL — AB (ref 150–400)
RBC: 4 MIL/uL — ABNORMAL LOW (ref 4.22–5.81)
RDW: 23.5 % — AB (ref 11.5–15.5)
WBC: 6.8 10*3/uL (ref 4.0–10.5)

## 2015-05-12 LAB — GLUCOSE, CAPILLARY
GLUCOSE-CAPILLARY: 137 mg/dL — AB (ref 65–99)
GLUCOSE-CAPILLARY: 160 mg/dL — AB (ref 65–99)
Glucose-Capillary: 107 mg/dL — ABNORMAL HIGH (ref 65–99)
Glucose-Capillary: 118 mg/dL — ABNORMAL HIGH (ref 65–99)
Glucose-Capillary: 162 mg/dL — ABNORMAL HIGH (ref 65–99)

## 2015-05-12 LAB — LIPASE, BLOOD: LIPASE: 30 U/L (ref 11–51)

## 2015-05-12 LAB — MAGNESIUM: Magnesium: 1.4 mg/dL — ABNORMAL LOW (ref 1.7–2.4)

## 2015-05-12 LAB — MRSA PCR SCREENING: MRSA BY PCR: POSITIVE — AB

## 2015-05-12 LAB — AMYLASE: AMYLASE: 37 U/L (ref 28–100)

## 2015-05-12 LAB — PROCALCITONIN: PROCALCITONIN: 23.3 ng/mL

## 2015-05-12 LAB — LACTATE DEHYDROGENASE: LDH: 447 U/L — ABNORMAL HIGH (ref 98–192)

## 2015-05-12 LAB — HEPARIN LEVEL (UNFRACTIONATED)
HEPARIN UNFRACTIONATED: 0.17 [IU]/mL — AB (ref 0.30–0.70)
Heparin Unfractionated: 0.12 IU/mL — ABNORMAL LOW (ref 0.30–0.70)

## 2015-05-12 LAB — CBG MONITORING, ED: GLUCOSE-CAPILLARY: 114 mg/dL — AB (ref 65–99)

## 2015-05-12 MED ORDER — FENTANYL CITRATE (PF) 100 MCG/2ML IJ SOLN
100.0000 ug | INTRAMUSCULAR | Status: DC | PRN
  Administered 2015-05-12 (×2): 100 ug via INTRAVENOUS
  Filled 2015-05-12 (×2): qty 2

## 2015-05-12 MED ORDER — LEVOFLOXACIN IN D5W 750 MG/150ML IV SOLN
750.0000 mg | INTRAVENOUS | Status: DC
  Filled 2015-05-12: qty 150

## 2015-05-12 MED ORDER — FENTANYL CITRATE (PF) 100 MCG/2ML IJ SOLN: 100.0000 ug | INTRAMUSCULAR | Status: DC | PRN

## 2015-05-12 MED ORDER — MUPIROCIN 2 % EX OINT
1.0000 | TOPICAL_OINTMENT | Freq: Two times a day (BID) | CUTANEOUS | Status: DC
Start: 2015-05-12 — End: 2015-05-16
  Administered 2015-05-12 – 2015-05-16 (×9): 1 via NASAL
  Filled 2015-05-12 (×3): qty 22

## 2015-05-12 MED ORDER — HEPARIN BOLUS VIA INFUSION
3000.0000 [IU] | Freq: Once | INTRAVENOUS | Status: AC
  Administered 2015-05-12: 3000 [IU] via INTRAVENOUS
  Filled 2015-05-12: qty 3000

## 2015-05-12 MED ORDER — SODIUM BICARBONATE 8.4 % IV SOLN
INTRAVENOUS | Status: DC
  Administered 2015-05-12 – 2015-05-13 (×2): via INTRAVENOUS
  Filled 2015-05-12 (×3): qty 50

## 2015-05-12 MED ORDER — DEXTROSE 5 % IV SOLN
1.0000 g | Freq: Three times a day (TID) | INTRAVENOUS | Status: DC
  Administered 2015-05-12: 1 g via INTRAVENOUS
  Filled 2015-05-12 (×3): qty 1

## 2015-05-12 MED ORDER — CHLORHEXIDINE GLUCONATE 0.12% ORAL RINSE (MEDLINE KIT)
15.0000 mL | Freq: Two times a day (BID) | OROMUCOSAL | Status: DC
  Administered 2015-05-12 – 2015-05-16 (×9): 15 mL via OROMUCOSAL

## 2015-05-12 MED ORDER — SODIUM CHLORIDE 0.9 % IV SOLN
1.0000 g | Freq: Once | INTRAVENOUS | Status: AC
  Administered 2015-05-12: 1 g via INTRAVENOUS
  Filled 2015-05-12: qty 10

## 2015-05-12 MED ORDER — MIDAZOLAM HCL 2 MG/2ML IJ SOLN
INTRAMUSCULAR | Status: AC
  Filled 2015-05-12: qty 2

## 2015-05-12 MED ORDER — HEPARIN (PORCINE) IN NACL 100-0.45 UNIT/ML-% IJ SOLN
2500.0000 [IU]/h | INTRAMUSCULAR | Status: DC
  Administered 2015-05-12: 1600 [IU]/h via INTRAVENOUS
  Administered 2015-05-13: 2200 [IU]/h via INTRAVENOUS
  Filled 2015-05-12 (×6): qty 250

## 2015-05-12 MED ORDER — HYDROCORTISONE NA SUCCINATE PF 100 MG IJ SOLR
50.0000 mg | Freq: Four times a day (QID) | INTRAMUSCULAR | Status: DC
  Administered 2015-05-12 – 2015-05-13 (×5): 50 mg via INTRAVENOUS
  Filled 2015-05-12: qty 2
  Filled 2015-05-12 (×2): qty 1
  Filled 2015-05-12 (×3): qty 2
  Filled 2015-05-12 (×2): qty 1
  Filled 2015-05-12: qty 2

## 2015-05-12 MED ORDER — PNEUMOCOCCAL VAC POLYVALENT 25 MCG/0.5ML IJ INJ: 0.5000 mL | INJECTION | INTRAMUSCULAR | Status: DC | PRN

## 2015-05-12 MED ORDER — MAGNESIUM SULFATE 2 GM/50ML IV SOLN
2.0000 g | Freq: Once | INTRAVENOUS | Status: AC
  Administered 2015-05-12: 2 g via INTRAVENOUS
  Filled 2015-05-12: qty 50

## 2015-05-12 MED ORDER — NOREPINEPHRINE BITARTRATE 1 MG/ML IV SOLN
0.0000 ug/min | INTRAVENOUS | Status: DC
  Administered 2015-05-12: 30 ug/min via INTRAVENOUS
  Administered 2015-05-12: 20 ug/min via INTRAVENOUS
  Filled 2015-05-12 (×3): qty 16

## 2015-05-12 MED ORDER — SODIUM CHLORIDE 0.9 % IV SOLN
250.0000 mg | Freq: Four times a day (QID) | INTRAVENOUS | Status: DC
  Administered 2015-05-12 – 2015-05-13 (×5): 250 mg via INTRAVENOUS
  Filled 2015-05-12 (×7): qty 250

## 2015-05-12 MED ORDER — SODIUM CHLORIDE 0.9 % IV BOLUS (SEPSIS)
500.0000 mL | Freq: Once | INTRAVENOUS | Status: AC
  Administered 2015-05-12: 500 mL via INTRAVENOUS

## 2015-05-12 MED ORDER — SODIUM CHLORIDE 0.9 % IV SOLN
INTRAVENOUS | Status: DC | PRN
  Administered 2015-05-12: 03:00:00 via INTRA_ARTERIAL

## 2015-05-12 MED ORDER — MIDAZOLAM HCL 2 MG/2ML IJ SOLN
2.0000 mg | Freq: Once | INTRAMUSCULAR | Status: AC
  Administered 2015-05-12: 2 mg via INTRAVENOUS

## 2015-05-12 MED ORDER — VASOPRESSIN 20 UNIT/ML IV SOLN
0.0300 [IU]/min | INTRAVENOUS | Status: DC
  Administered 2015-05-12 – 2015-05-13 (×2): 0.03 [IU]/min via INTRAVENOUS
  Filled 2015-05-12 (×2): qty 2

## 2015-05-12 MED ORDER — CHLORHEXIDINE GLUCONATE CLOTH 2 % EX PADS
6.0000 | MEDICATED_PAD | Freq: Every day | CUTANEOUS | Status: DC
  Administered 2015-05-12 – 2015-05-16 (×4): 6 via TOPICAL

## 2015-05-12 MED ORDER — NOREPINEPHRINE BITARTRATE 1 MG/ML IV SOLN
0.0000 ug/min | INTRAVENOUS | Status: DC
  Administered 2015-05-12: 12 ug/min via INTRAVENOUS
  Filled 2015-05-12: qty 4

## 2015-05-12 MED ORDER — ANTISEPTIC ORAL RINSE SOLUTION (CORINZ)
7.0000 mL | Freq: Four times a day (QID) | OROMUCOSAL | Status: DC
  Administered 2015-05-12 – 2015-05-16 (×14): 7 mL via OROMUCOSAL

## 2015-05-12 MED ORDER — HEPARIN BOLUS VIA INFUSION
2000.0000 [IU] | Freq: Once | INTRAVENOUS | Status: AC
  Administered 2015-05-12: 2000 [IU] via INTRAVENOUS
  Filled 2015-05-12: qty 2000

## 2015-05-12 NOTE — Progress Notes (Signed)
Initial Nutrition Assessment  DOCUMENTATION CODES:   Morbid obesity  INTERVENTION:    Recommend initiate TF via Cortrak tube with Vital High Protein at 25 ml/h and Prostat 30 ml BID on day 1; on day 2, d/c Prostat and increase to goal rate of 80 ml/h (1920 ml per day) to provide 1920 kcals, 168 gm protein, 1605 ml free water daily.  NUTRITION DIAGNOSIS:   Inadequate oral intake related to inability to eat as evidenced by NPO status.  GOAL:   Provide needs based on ASPEN/SCCM guidelines  MONITOR:   Vent status, Labs, Weight trends, I & O's  REASON FOR ASSESSMENT:   Ventilator    ASSESSMENT:   60 year old male with a past medical history of diabetes, CHF, CKD, chronic respiratory failure with tracheostomy (nighttime ventilator use and daytime trach collar) [facility] for low urine output, fever, and lethargy.  Labs reviewed: phosphorus elevated, magnesium low  Plans to place Cortrak tube today. May not feed until tomorrow depending on whether or not surgery wants to take patient to the OR today. Per surgery note, no indications for surgery at this time; no acute infection. Patient usually takes PO's during the day while on trach collar. Requires ventilator at night.   Patient is currently intubated on ventilator support Temp (24hrs), Avg:100.6 F (38.1 C), Min:99.8 F (37.7 C), Max:101.9 F (38.8 C)   Diet Order:  Diet NPO time specified  Skin:  Wound (see comment) (axilla wound-hidradenitis; MASD to groin)  Last BM:  1/5  Height:   Ht Readings from Last 1 Encounters:  05/12/15 5\' 6"  (1.676 m)    Weight:   Wt Readings from Last 1 Encounters:  05/12/15 339 lb 1.1 oz (153.8 kg)    Ideal Body Weight:  64.5 kg  BMI:  Body mass index is 54.75 kg/(m^2).  Estimated Nutritional Needs:   Kcal:  [id number]  Protein:  161 gm  Fluid:  2 L  EDUCATION NEEDS:   No education needs identified at this  time  Joaquin CourtsKimberly Harris, RD, LDN, CNSC Pager [phone] After Hours Pager [phone]

## 2015-05-12 NOTE — Procedures (Signed)
Central Venous Catheter Insertion Procedure Note Daniel SouthwardDanny Maynard [id number] 10/31/1955  Procedure: Insertion of Central Venous Catheter Indications: Assessment of intravascular volume, Drug and/or fluid administration and Frequent blood sampling  Procedure Details Consent: Risks of procedure as well as the alternatives and risks of each were explained to the (patient/caregiver).  Consent for procedure obtained. Time Out: Verified patient identification, verified procedure, site/side was marked, verified correct patient position, special equipment/implants available, medications/allergies/relevent history reviewed, required imaging and test results available.  Performed  Maximum sterile technique was used including antiseptics, cap, gloves, gown, hand hygiene, mask and sheet. Skin prep: Chlorhexidine; local anesthetic administered A antimicrobial bonded/coated triple lumen catheter was placed in the left internal jugular vein using the Seldinger technique.  Evaluation Blood flow good Complications: No apparent complications Patient did tolerate procedure well. Chest X-ray ordered to verify placement.  CXR: pending.  Procedure performed under direct ultrasound guidance for real time vessel cannulation.      Daniel Maynard, [place]PA - [facility] Pager: [phone]  or [phone] 05/12/2015, 2:53 AM

## 2015-05-12 NOTE — Progress Notes (Signed)
Pharmacy Antibiotic Follow-up Note  Daniel SouthwardDanny Maynard is a 60 y.o. year-old male admitted on 05/09/2015.  The patient is currently on day 1 of vancomycin/imipenem for sepsis.  Assessment/Plan: - Will check vancomycin random tomorrow morning and re-dose as needed. - Imipenem 250mg  IV q6h - Trend WBC, temp, renal function, C&S    Temp (24hrs), Avg:100.6 F (38.1 C), Min:99.8 F (37.7 C), Max:101.9 F (38.8 C)   Recent Labs Lab 06/06/2015 2138 05/12/15 0530  WBC 7.9 6.8    Recent Labs Lab 05/27/2015 2138 05/12/15 0530 05/12/15 1000  CREATININE 4.58* 4.18* 3.65*   Estimated Creatinine Clearance: 31.1 mL/min (by C-G formula based on Cr of 3.65).    Allergies  Allergen Reactions  . Hydrocodone     Unknown  . Morphine And Related     unknown  . Zosyn [Piperacillin Sod-Tazobactam So]     Unknown    Antimicrobials this admission: Aztreonam 1/4>>1/5 Levaquin 1/4>>1/5 Vancomycin 1/4>> Imipenem 1/5>>  Microbiology results: 1/4 BCx -  1/4 UCx -  1/5 MRSA PCR - Positive 1/5 Wound Cx -   Thank you for allowing pharmacy to be a part of this patient's care.  Casilda Carlsaylor Jashanti Clinkscale, PharmD. PGY-1 Pharmacy Resident Pager: [phone] 05/12/2015 11:52 AM

## 2015-05-12 NOTE — Progress Notes (Signed)
ANTIBIOTIC CONSULT NOTE - INITIAL  Pharmacy Consult for Vancomycin/Aztreonam Indication: rule out sepsis  Allergies  Allergen Reactions  . Hydrocodone     Unknown  . Morphine And Related     unknown  . Zosyn [Piperacillin Sod-Tazobactam So]     Unknown    Patient Measurements: 153 kg  Vital Signs: Temp: 101.9 F (38.8 C) (01/04 2042) Temp Source: Rectal (01/04 2042) BP: 91/67 mmHg (01/05 0015) Pulse Rate: 84 (01/05 0015) Intake/Output from previous day: 01/04 0701 - 01/05 0700 In: 3000 [I.V.:3000] Out: 0  Intake/Output from this shift: Total I/O In: 3000 [I.V.:3000] Out: 0   Labs:  Recent Labs  08-11-15 2138  WBC 7.9  HGB 10.9*  PLT 130*  CREATININE 4.58*   Estimated Creatinine Clearance: 25.1 mL/min (by C-G formula based on Cr of 4.58).    Medical History: Past Medical History  Diagnosis Date  . Hypertension   . CHF (congestive heart failure) (HCC)   . Renal disorder   . Diabetes mellitus without complication (HCC)   . Sleep apnea     Assessment: [name] from [facility], [facility], appears to be in acute on chronic renal failure, WBC WNL, abnormal U/A, other labs reviewed.   Goal of Therapy:  Vancomycin trough level 15-20 mcg/ml  Plan:  -Vancomycin 2500 mg IV x 1 given in ED, would check random vancomycin level 1/6 AM given renal function -Aztreonam 1g IV q8h -Trend WBC, temp, renal function  -Drug levels as indicated   Abran DukeLedford, Crislyn Willbanks 05/12/2015,12:38 AM

## 2015-05-12 NOTE — Consult Note (Signed)
Riley Churches Mar 30, 1956  [id number].   Requesting MD: Dr. Merrie Roof Chief Complaint/Reason for Consult: right axillary drainage HPI: This is a 60 yo black male with chronic respiratory failure who has a tracheostomy who resides at [facility].  He is on trach collar during the day and on the vent at night.  He was admitted due to hypotension and a concern for sepsis.  He was noted to have drainage from his right axilla and a history of hidradenitis.  He was then also noted to have some drainage from the left side of his scrotum.  We have been asked to see him to make sure he does not need any further surgical intervention.  The patient is currently on the vent an unable to provide a history.  ROS: unable to obtain due to vented status.  No family history on file.  Past Medical History  Diagnosis Date  . Hypertension   . CHF (congestive heart failure) ([place])   . Renal disorder   . Diabetes mellitus without complication ([place])   . Sleep apnea   . Chronic respiratory failure with hypercapnia [facility])     Past Surgical History  Procedure Laterality Date  . Tracheostomy      Social History:  has no tobacco, alcohol, and drug history on file.  Allergies:  Allergies  Allergen Reactions  . Hydrocodone     Unknown  . Morphine And Related     unknown  . Zosyn [Piperacillin Sod-Tazobactam So]     Unknown    Medications Prior to Admission  Medication Sig Dispense Refill  . acetaminophen (TYLENOL) 325 MG tablet Take 325 mg by mouth every 6 (six) hours as needed for mild pain or moderate pain.     Marland Kitchen amLODipine (NORVASC) 10 MG tablet Take 10 mg by mouth daily.    Marland Kitchen aspirin EC 81 MG tablet Take 81 mg by mouth daily.    . bisacodyl (DULCOLAX) 10 MG suppository Place 10 mg rectally as needed for mild constipation or moderate constipation.    . budesonide (PULMICORT) 0.5 MG/2ML nebulizer solution Take 0.5 mg by nebulization 2 (two) times daily.    . carvedilol (COREG) 25 MG tablet  Take 25 mg by mouth 2 (two) times daily.    . chlorhexidine (PERIDEX) 0.12 % solution Use as directed 15 mLs in the mouth or throat 2 (two) times daily.    . diclofenac sodium (VOLTAREN) 1 % GEL Apply 1 application topically 4 (four) times daily.    Marland Kitchen dronedarone (MULTAQ) 400 MG tablet Take 400 mg by mouth 2 (two) times daily with a meal.    . famotidine (PEPCID) 20 MG tablet Take 20 mg by mouth at bedtime.    . febuxostat (ULORIC) 40 MG tablet Take 40 mg by mouth daily.    Marland Kitchen gabapentin (NEURONTIN) 100 MG capsule Take 200 mg by mouth 2 (two) times daily.    . insulin aspart (NOVOLOG) 100 UNIT/ML injection Inject 3-20 Units into the skin 3 (three) times daily with meals as needed for high blood sugar (151-200 = 3 units, 201-300 = 5 units, 301-400 = 10 units, 401-500 = 15 units, 501+ = 20 units).     Marland Kitchen ipratropium-albuterol (DUONEB) 0.5-2.5 (3) MG/3ML SOLN Take 3 mLs by nebulization every 6 (six) hours.    . torsemide (DEMADEX) 10 MG tablet Take 2 tablets (20 mg total) by mouth daily.    . colchicine 0.6 MG tablet Take 0.6 mg by mouth 2 (two) times daily.  Blood pressure 116/54, pulse 86, temperature 100 F (37.8 C), temperature source Oral, resp. rate 26, height 5' 6"  (1.676 m), weight 153.8 kg (339 lb 1.1 oz), SpO2 98 %. Physical Exam: General: morbidly obese black male who is laying in bed in NAD HEENT: head is normocephalic, atraumatic.  Sclera are noninjected.  PERRL.  Ears and nose without any masses or lesions.  Neck with trach in place Heart: regular, rate, and rhythm.  Normal s1,s2. No obvious murmurs, gallops, or rubs noted.  Palpable radial and pedal pulses bilaterally Lungs: few crackles, no wheezes, rhonchi, or rales noted.  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  Minimal drainage currently.  No acute abscess that needs to be drained.  Scrotum has some chronic thickness throughout.  He has several chronic fistulae as well on the left side with some purulent drainage.  After multiple attempts at expression, minimal drainage was able to be expressed when I was finished with his exam Psych: on vent, arouses to name and opens eyes    Results for orders placed or performed during the hospital encounter of 05/10/2015 (from the past 48 hour(s))  I-Stat arterial blood gas, ED     Status: Abnormal   Collection Time: 05/12/2015  8:49 PM  Result Value Ref Range   pH, Arterial 7.384 7.350 - 7.450   pCO2 arterial 41.3 35.0 - 45.0 mmHg   pO2, Arterial 53.0 (L) 80.0 - 100.0 mmHg   Bicarbonate 24.2 (H) 20.0 - 24.0 mEq/L   TCO2 25 0 - 100 mmol/L   O2 Saturation 83.0 %   Patient temperature 101.9 F    Collection site RADIAL, Tyson'S TEST ACCEPTABLE    Drawn by Operator    Sample type ARTERIAL   Urinalysis, Routine w reflex microscopic (not at [facility])     Status: Abnormal   Collection Time: 05/15/2015  9:02 PM  Result Value Ref Range   Color, Urine AMBER (A) YELLOW    Comment: BIOCHEMICALS MAY BE AFFECTED BY COLOR   APPearance TURBID (A) CLEAR   Specific Gravity, Urine 1.024 1.005 - 1.030   pH 5.0 5.0 - 8.0   Glucose, UA NEGATIVE NEGATIVE mg/dL   Hgb urine dipstick SMALL (A) NEGATIVE   Bilirubin Urine MODERATE (A) NEGATIVE   Ketones, ur 15 (A) NEGATIVE mg/dL   Protein, ur 30 (A) NEGATIVE mg/dL   Nitrite POSITIVE (A) NEGATIVE   Leukocytes, UA SMALL (A) NEGATIVE  Urine microscopic-add on     Status: Abnormal   Collection Time: 05/28/2015  9:02 PM  Result Value Ref Range   Squamous Epithelial / LPF 0-5 (A) NONE SEEN   WBC, UA 0-5 0 - 5 WBC/hpf   RBC / HPF 0-5 0 - 5 RBC/hpf   Bacteria, UA MANY (A) NONE SEEN   Casts GRANULAR CAST (A) NEGATIVE    Comment: HYALINE CASTS   Crystals CA OXALATE CRYSTALS (A) NEGATIVE  I-Stat CG4  Lactic Acid, ED  (not at [facility])     Status: Abnormal   Collection Time: 06/07/2015  9:36 PM  Result Value Ref Range   Lactic Acid, Venous 2.65 (HH) 0.5 - 2.0 mmol/L   Comment NOTIFIED PHYSICIAN   Comprehensive metabolic panel     Status: Abnormal   Collection Time: 05/25/2015  9:38 PM  Result Value Ref Range   Sodium 138 135 - 145 mmol/L   Potassium 4.5 3.5 - 5.1 mmol/L   Chloride 100 (L) 101 - 111 mmol/L   CO2 24 22 - 32 mmol/L   Glucose, Bld 97 65 - 99 mg/dL   BUN 92 (H) 6 - 20 mg/dL   Creatinine, Ser 4.58 (H) 0.61 - 1.24 mg/dL   Calcium 7.2 (L) 8.9 - 10.3 mg/dL   Total Protein 6.7 6.5 - 8.1 g/dL   Albumin 1.6 (L) 3.5 - 5.0 g/dL   AST 55 (H) 15 - 41 U/L   ALT 30 17 - 63 U/L   Alkaline Phosphatase 118 38 - 126 U/L   Total Bilirubin 1.1 0.3 - 1.2 mg/dL   GFR calc non Af Amer 13 (L) >60 mL/min   GFR calc Af Amer 15 (L) >60 mL/min    Comment: (NOTE) The eGFR has been calculated using the CKD EPI equation. This calculation has not been validated in all clinical situations. eGFR's persistently <60 mL/min signify possible Chronic Kidney Disease.    Anion gap 14 5 - 15  CBC with Differential     Status: Abnormal   Collection Time: 05/15/2015  9:38 PM  Result Value Ref Range   WBC 7.9 4.0 - 10.5 K/uL   RBC 4.03 (L) 4.22 - 5.81 MIL/uL   Hemoglobin 10.9 (L) 13.0 - 17.0 g/dL   HCT 34.5 (L) 39.0 - 52.0 %   MCV 85.6 78.0 - 100.0 fL   MCH 27.0 26.0 - 34.0 pg   MCHC 31.6 30.0 - 36.0 g/dL   RDW 23.4 (H) 11.5 - 15.5 %   Platelets 130 (L) 150 - 400 K/uL   Neutrophils Relative % 77 %   Lymphocytes Relative 19 %   Monocytes Relative 3 %   Eosinophils Relative 0 %   Basophils Relative 1 %   Neutro Abs 6.1 1.7 - 7.7 K/uL   Lymphs Abs 1.5 0.7 - 4.0 K/uL   Monocytes Absolute 0.2 0.1 - 1.0 K/uL   Eosinophils Absolute 0.0 0.0 - 0.7 K/uL   Basophils Absolute 0.1 0.0 - 0.1 K/uL   RBC Morphology POLYCHROMASIA PRESENT   Brain natriuretic peptide     Status: Abnormal   Collection Time: 05/29/2015   9:38 PM  Result Value Ref Range   B Natriuretic Peptide 217.7 (H) 0.0 - 100.0 pg/mL  Lactic acid, plasma     Status: Abnormal   Collection Time: 05/12/15 12:03 AM  Result Value Ref Range   Lactic Acid, Venous 2.1 (HH) 0.5 - 2.0 mmol/L    Comment: CRITICAL RESULT CALLED TO, READ BACK BY AND VERIFIED WITH: JACOBELI B,RN 05/12/15 0035 WAYK   Cortisol     Status: None   Collection Time: 05/12/15 12:03 AM  Result Value Ref Range   Cortisol, Plasma 20.1 ug/dL    Comment: (NOTE) AM    6.7 - 22.6 ug/dL PM   <10.0       ug/dL   Troponin I     Status: Abnormal   Collection Time: 05/12/15 12:04 AM  Result Value Ref Range   Troponin I 0.08 (H) <0.031 ng/mL    Comment:        PERSISTENTLY INCREASED TROPONIN VALUES IN THE RANGE OF 0.04-0.49 ng/mL CAN BE SEEN IN:       -UNSTABLE ANGINA       -CONGESTIVE HEART FAILURE       -MYOCARDITIS       -CHEST TRAUMA       -  ARRYHTHMIAS       -LATE PRESENTING MYOCARDIAL INFARCTION       -COPD   CLINICAL FOLLOW-UP RECOMMENDED.   CBG monitoring, ED     Status: Abnormal   Collection Time: 05/12/15 12:16 AM  Result Value Ref Range   Glucose-Capillary 114 (H) 65 - 99 mg/dL  MRSA PCR Screening     Status: Abnormal   Collection Time: 05/12/15  1:11 AM  Result Value Ref Range   MRSA by PCR POSITIVE (A) NEGATIVE    Comment:        The GeneXpert MRSA Assay (FDA approved for NASAL specimens only), is one component of a comprehensive MRSA colonization surveillance program. It is not intended to diagnose MRSA infection nor to guide or monitor treatment for MRSA infections. RESULT CALLED TO, READ BACK BY AND VERIFIED WITH: TOOMES,RN @0346  05/14/15 MKELLY   Glucose, capillary     Status: Abnormal   Collection Time: 05/12/15  1:30 AM  Result Value Ref Range   Glucose-Capillary 107 (H) 65 - 99 mg/dL  CBC     Status: Abnormal   Collection Time: 05/12/15  5:30 AM  Result Value Ref Range   WBC 6.8 4.0 - 10.5 K/uL   RBC 4.00 (L) 4.22 - 5.81 MIL/uL    Hemoglobin 10.9 (L) 13.0 - 17.0 g/dL   HCT 34.1 (L) 39.0 - 52.0 %   MCV 85.3 78.0 - 100.0 fL   MCH 27.3 26.0 - 34.0 pg   MCHC 32.0 30.0 - 36.0 g/dL   RDW 23.5 (H) 11.5 - 15.5 %   Platelets 118 (L) 150 - 400 K/uL    Comment: SPECIMEN CHECKED FOR CLOTS PLATELET COUNT CONFIRMED BY SMEAR   Basic metabolic panel     Status: Abnormal   Collection Time: 05/12/15  5:30 AM  Result Value Ref Range   Sodium 138 135 - 145 mmol/L   Potassium 4.2 3.5 - 5.1 mmol/L   Chloride 103 101 - 111 mmol/L   CO2 21 (L) 22 - 32 mmol/L   Glucose, Bld 129 (H) 65 - 99 mg/dL   BUN 89 (H) 6 - 20 mg/dL   Creatinine, Ser 4.18 (H) 0.61 - 1.24 mg/dL   Calcium 6.5 (L) 8.9 - 10.3 mg/dL   GFR calc non Af Amer 14 (L) >60 mL/min   GFR calc Af Amer 17 (L) >60 mL/min    Comment: (NOTE) The eGFR has been calculated using the CKD EPI equation. This calculation has not been validated in all clinical situations. eGFR's persistently <60 mL/min signify possible Chronic Kidney Disease.    Anion gap 14 5 - 15  Magnesium     Status: Abnormal   Collection Time: 05/12/15  5:30 AM  Result Value Ref Range   Magnesium 1.4 (L) 1.7 - 2.4 mg/dL  Phosphorus     Status: Abnormal   Collection Time: 05/12/15  5:30 AM  Result Value Ref Range   Phosphorus 5.8 (H) 2.5 - 4.6 mg/dL  Procalcitonin     Status: None   Collection Time: 05/12/15  5:30 AM  Result Value Ref Range   Procalcitonin 23.30 ng/mL    Comment:        Interpretation: PCT >= 10 ng/mL: Important systemic inflammatory response, almost exclusively due to severe bacterial sepsis or septic shock. (NOTE)         ICU PCT Algorithm               Non ICU PCT Algorithm    ----------------------------     ------------------------------  PCT < 0.25 ng/mL                 PCT < 0.1 ng/mL     Stopping of antibiotics            Stopping of antibiotics       strongly encouraged.               strongly encouraged.    ----------------------------      ------------------------------       PCT level decrease by               PCT < 0.25 ng/mL       >= 80% from peak PCT       OR PCT 0.25 - 0.5 ng/mL          Stopping of antibiotics                                             encouraged.     Stopping of antibiotics           encouraged.    ----------------------------     ------------------------------       PCT level decrease by              PCT >= 0.25 ng/mL       < 80% from peak PCT        AND PCT >= 0.5 ng/mL             Continuing antibiotics                                              encouraged.       Continuing antibiotics            encouraged.    ----------------------------     ------------------------------     PCT level increase compared          PCT > 0.5 ng/mL         with peak PCT AND          PCT >= 0.5 ng/mL             Escalation of antibiotics                                          strongly encouraged.      Escalation of antibiotics        strongly encouraged.   Troponin I     Status: Abnormal   Collection Time: 05/12/15  5:30 AM  Result Value Ref Range   Troponin I 0.08 (H) <0.031 ng/mL    Comment:        PERSISTENTLY INCREASED TROPONIN VALUES IN THE RANGE OF 0.04-0.49 ng/mL CAN BE SEEN IN:       -UNSTABLE ANGINA       -CONGESTIVE HEART FAILURE       -MYOCARDITIS       -CHEST TRAUMA       -ARRYHTHMIAS       -LATE PRESENTING MYOCARDIAL INFARCTION       -COPD   CLINICAL FOLLOW-UP RECOMMENDED.   I-STAT 3, arterial blood gas (G3+)  Status: Abnormal   Collection Time: 05/12/15  6:03 AM  Result Value Ref Range   pH, Arterial 7.303 (L) 7.350 - 7.450   pCO2 arterial 43.1 35.0 - 45.0 mmHg   pO2, Arterial 71.0 (L) 80.0 - 100.0 mmHg   Bicarbonate 21.3 20.0 - 24.0 mEq/L   TCO2 23 0 - 100 mmol/L   O2 Saturation 92.0 %   Acid-base deficit 5.0 (H) 0.0 - 2.0 mmol/L   Patient temperature 99.0 F    Sample type ARTERIAL   Glucose, capillary     Status: Abnormal   Collection Time: 05/12/15  7:42 AM  Result  Value Ref Range   Glucose-Capillary 118 (H) 65 - 99 mg/dL   Dg Chest Port 1 View  05/12/2015  CLINICAL DATA:  Central line placement. EXAM: PORTABLE CHEST 1 VIEW COMPARISON:  Yesterday at 2124 hour FINDINGS: New left internal jugular central venous catheter tip in the region of a proximal SVC. Tracheostomy tube remains the thoracic inlet. Left-sided pacemaker in place. No evidence of pneumothorax. Multi focal bilateral patchy opacities of mildly progressed in the interim. Question of developing pleural effusions with hazy opacity at the lung bases. Cardiomegaly is stable. IMPRESSION: 1. Tip of the left central line in the proximal SVC. No evidence of pneumothorax. 2. Worsening lung aeration with multifocal bilateral patchy opacities. Developing hazy opacity at the lung bases, likely pleural effusions. Multifocal pneumonia versus CHF. Electronically Signed   By: Jeb Levering M.D.   On: 05/12/2015 03:15   Dg Chest Port 1 View  05/10/2015  CLINICAL DATA:  Fever and lethargy for 1 day EXAM: PORTABLE CHEST 1 VIEW COMPARISON:  May 02, 2015 FINDINGS: Tracheostomy catheter tip is 3.0 cm above the carina. Pacemaker leads are attached to the right atrium right ventricle. No pneumothorax. There is patchy infiltrate in the left mid lung, right mid lung, and right base regions. Lungs elsewhere clear. Heart is mildly enlarged with pulmonary vascularity within normal limits. No adenopathy. No bone lesions. IMPRESSION: Patchy infiltrate in the left mid lung, right mid lung and right base regions, consistent with multifocal pneumonia. Stable cardiac prominence. Tracheostomy catheter as described without pneumothorax. Electronically Signed   By: Lowella Grip III M.D.   On: 05/14/2015 21:46       Assessment/Plan 1. Chronic hidradenitis of axilla and scrotum -no acute infection identified on exam that would require surgical drainage.  He does have purulent drainage noted in his right axilla and some of the  left scrotum, however, this appears to be typical chronic drainage through chronic fistulae.  I do not think this is the source of his sepsis. -no surgical indications.  He is already on broad spectrum abx therapy which will help dry these drainage fistulae. -call back if needed.  Gianni Fuchs E 05/12/2015, 9:52 AM Pager: [phone]

## 2015-05-12 NOTE — Progress Notes (Signed)
Dr. Nicholos Johnsamachandran with [facility] was notified of pt low urine output.

## 2015-05-12 NOTE — Progress Notes (Signed)
CRITICAL VALUE ALERT  Critical value received:  Calcium 6.0  Date of notification: 05/12/15  Time of notification:  1145  Critical value read back:Yes.    Nurse who received alert:  Edison PaceAshley Debraann Livingstone  MD notified (1st page): Dr Dimple Caseyice

## 2015-05-12 NOTE — Progress Notes (Signed)
Utilization review completed. Lamia Mariner, RN, BSN. 

## 2015-05-12 NOTE — Progress Notes (Signed)
ANTICOAGULATION CONSULT NOTE - Initial Consult  Pharmacy Consult for Heparin  Indication: atrial fibrillation  Allergies  Allergen Reactions  . Hydrocodone     Unknown  . Morphine And Related     unknown  . Zosyn [Piperacillin Sod-Tazobactam So]     Unknown    Patient Measurements: Height: 5\' 6"  (167.6 cm) Weight: (!) 339 lb 1.1 oz (153.8 kg) IBW/kg (Calculated) : 63.8  Vital Signs: Temp: 99.8 F (37.7 C) (01/05 0120) Temp Source: Oral (01/05 0120) BP: 108/69 mmHg (01/05 0215) Pulse Rate: 70 (01/05 0215)  Labs:  Recent Labs  05/18/2015 2138 05/12/15 0004  HGB 10.9*  --   HCT 34.5*  --   PLT 130*  --   CREATININE 4.58*  --   TROPONINI  --  0.08*    Estimated Creatinine Clearance: 24.8 mL/min (by C-G formula based on Cr of 4.58).   Medical History: Past Medical History  Diagnosis Date  . Hypertension   . CHF (congestive heart failure) (HCC)   . Renal disorder   . Diabetes mellitus without complication (HCC)   . Sleep apnea     Assessment: [name] transferred from [facility] with acute on chronic renal failure/fever/lethargy, now starting heparin for afib, Hgb 10.9, other labs/meds reviewed.   Goal of Therapy:  Heparin level 0.3-0.7 units/ml Monitor platelets by anticoagulation protocol: Yes   Plan:  -No bolus with recent subcutaneous heparin  -Start heparin drip at 1600 units/hr -1000 HL -Daily CBC/HL -Monitor for bleeding  Daniel Maynard, Daniel Maynard 05/12/2015,2:35 AM

## 2015-05-12 NOTE — Consult Note (Addendum)
WOC consult requested prior to surgical team involvement. Pt was noted to have drainage from his right axilla and a history of hidradenitis.  This complex medical condition is beyond WOC scope of practice.  Refer to [facility] team progress note for assessment and plan of care and any further questions. Please re-consult if further assistance is needed.  Thank-you,  Cammie Mcgeeawn Forrest Jaroszewski MSN, RN, CWOCN, [place]CN-AP, CNS [phone]

## 2015-05-12 NOTE — Progress Notes (Signed)
PULMONARY / CRITICAL CARE MEDICINE   Name: Daniel Maynard MRN: [id number] DOB: 08-Jan-1956    ADMISSION DATE:  May 30, 2015  REFERRING MD : Dr. Clarene Duke   SUBJECTIVE: Oliguric overnight <100cc, purulent drainage continued of R axilla, pressures not responding adequately to fluids and started on levophed, vasopressin  VITAL SIGNS: Temp:  [99.8 F (37.7 C)-101.9 F (38.8 C)] 100 F (37.8 C) (01/05 0743) Pulse Rate:  [70-88] 81 (01/05 0645) Resp:  [17-29] 26 (01/05 0645) BP: (61-121)/(29-80) 121/80 mmHg (01/05 0530) SpO2:  [94 %-100 %] 100 % (01/05 0645) Arterial Line BP: (86-126)/(38-54) 126/54 mmHg (01/05 0645) FiO2 (%):  [55 %] 55 % (01/05 0400) Weight:  [153.8 kg (339 lb 1.1 oz)] 153.8 kg (339 lb 1.1 oz) (01/05 0130) HEMODYNAMICS: CVP:  [14 mmHg-18 mmHg] 17 mmHg VENTILATOR SETTINGS: Vent Mode:  [-] PRVC FiO2 (%):  [55 %] 55 % Set Rate:  [20 bmp] 20 bmp Vt Set:  [500 mL] 500 mL PEEP:  [5 cmH20] 5 cmH20 Plateau Pressure:  [21 cmH20-22 cmH20] 21 cmH20 INTAKE / OUTPUT: Intake/Output      01/04 0701 - 01/05 0700 01/05 0701 - 01/06 0700   I.V. (mL/kg) 6150.9 (40)    IV Piggyback 50    Total Intake(mL/kg) 6200.9 (40.3)    Urine (mL/kg/hr) 65    Total Output 65     Net +6135.9             PHYSICAL EXAMINATION: General:  Morbidly obese male with a tracheostomy collar (on vent) Neuro:  Awake, alert and following verbal commands appropriately, unable to vocalize 2/2 trach, ETT, moving head and upper extremities HEENT: PERRL and EOMI. Moist mucous membranes Cardiovascular:  RRR.  No murmurs, rubs, or gallops.  Lungs:  Distant breath sounds, coarse crackles in bilateral bases Abdomen: Soft. Positive bowel sounds. Tender to palpation Musculoskeletal:  2+ edema of bilateral lower extremities Skin: Purulent drainage noted in right axilla, without surrounding erythema  LABS:  CBC  Recent Labs Lab 30-May-2015 2138 05/12/15 0530  WBC 7.9 6.8  HGB 10.9* 10.9*  HCT 34.5* 34.1*  PLT  130* 118*   Coag's No results for input(s): APTT, INR in the last 168 hours. BMET  Recent Labs Lab May 30, 2015 2138 05/12/15 0530  NA 138 138  K 4.5 4.2  CL 100* 103  CO2 24 21*  BUN 92* 89*  CREATININE 4.58* 4.18*  GLUCOSE 97 129*   Electrolytes  Recent Labs Lab 05-30-2015 2138 05/12/15 0530  CALCIUM 7.2* 6.5*  MG  --  1.4*  PHOS  --  5.8*   Sepsis Markers  Recent Labs Lab 30-May-2015 2136 05/12/15 0003 05/12/15 0530  LATICACIDVEN 2.65* 2.1*  --   PROCALCITON  --   --  23.30   ABG  Recent Labs Lab 2015-05-30 2049 05/12/15 0603  PHART 7.384 7.303*  PCO2ART 41.3 43.1  PO2ART 53.0* 71.0*   Liver Enzymes  Recent Labs Lab 2015-05-30 2138  AST 55*  ALT 30  ALKPHOS 118  BILITOT 1.1  ALBUMIN 1.6*   Cardiac Enzymes  Recent Labs Lab 05/12/15 0004 05/12/15 0530  TROPONINI 0.08* 0.08*   Glucose  Recent Labs Lab 05/12/15 0016 05/12/15 0130  GLUCAP 114* 107*    Imaging Dg Chest Port 1 View  05/12/2015  CLINICAL DATA:  Central line placement. EXAM: PORTABLE CHEST 1 VIEW COMPARISON:  Yesterday at 2124 hour FINDINGS: New left internal jugular central venous catheter tip in the region of a proximal SVC. Tracheostomy tube remains the thoracic inlet.  Left-sided pacemaker in place. No evidence of pneumothorax. Multi focal bilateral patchy opacities of mildly progressed in the interim. Question of developing pleural effusions with hazy opacity at the lung bases. Cardiomegaly is stable. IMPRESSION: 1. Tip of the left central line in the proximal SVC. No evidence of pneumothorax. 2. Worsening lung aeration with multifocal bilateral patchy opacities. Developing hazy opacity at the lung bases, likely pleural effusions. Multifocal pneumonia versus CHF. Electronically Signed   By: Rubye OaksMelanie  Ehinger M.D.   On: 05/12/2015 03:15   Dg Chest Port 1 View  05/24/2015  CLINICAL DATA:  Fever and lethargy for 1 day EXAM: PORTABLE CHEST 1 VIEW COMPARISON:  May 02, 2015 FINDINGS:  Tracheostomy catheter tip is 3.0 cm above the carina. Pacemaker leads are attached to the right atrium right ventricle. No pneumothorax. There is patchy infiltrate in the left mid lung, right mid lung, and right base regions. Lungs elsewhere clear. Heart is mildly enlarged with pulmonary vascularity within normal limits. No adenopathy. No bone lesions. IMPRESSION: Patchy infiltrate in the left mid lung, right mid lung and right base regions, consistent with multifocal pneumonia. Stable cardiac prominence. Tracheostomy catheter as described without pneumothorax. Electronically Signed   By: Bretta BangWilliam  Woodruff III M.D.   On: Mar 01, 2016 21:46   Antibiotics Vancomycin 1/4>>> Aztreonam 1/4>>> Levaquin 1/4>>>  Cultures Blood 1/4>>> Urine 1/4>>> Wound 1/5>>>  CXR: Patchy infiltrate in the left midlung, right midlung and right base regions, consistent with multifocal pneumonia.  ASSESSMENT / PLAN:  PULMONARY A: CHF vs multifocal HCAP Pulm edema P:   continue ventilator support  ABX per ID DuoNeb every 6 hours Pulmicort neb BID Daily CXR cvp up, kvo ABG reviewed, rate 22  CARDIOVASCULAR A: sCHF Hx, but TTE showing normal LVEF 65-70%. HTN Hypotensive refractory to fluids Adrenal insuff P:  Levophed + vasopressin target MAP 60 Hold home medications Norvasc and Coreg in the setting of hypotension Hold home medication Torsemide in the setting of hypotension and kidney injury  ekg Prior echo reviewed Add stress roids May require swan Fluid challenge   RENAL A:  ATN in the setting of severe sepsis (hypotension) Oliguric AKI on CKD stage III Serum Cr 4.58 on admission now 4.18 after fluids; baseline approximately 1.7. P:   kvo Continue to monitor renal function  Consider RRT within 24 hrs if no urine output or renal panel response Repeat bmet 12 hrs Get renal US if not continued to progress replace Mag  GASTROINTESTINAL A:  GI prophylaxis P:    Famotidine for SUP Start TF  if not to OR  HEMATOLOGIC A: Normocytic anemia likely secondary to chronic kidney disease. Hemoglobin 10.9 normal MCV. DVT prophylaxis P:  Heparin continuous infusion for fib Repeat AM CBC  INFECTIOUS A: HCAP (unlikely) vs purulent R axillary wound source (favor) Blood, urine Cx pending P:  Surgery consult for ? Drainage of R axilla Vancomycin and Aztreonam  Tylenol suppository as needed for fevers pending urine culture pending blood cultures  Culture of drainage from R axilla   ENDOCRINE A: DM 2 AI P:   Monitor CBGs sliding scale insulin Starting stress roids  NEUROLOGIC A: Alert and oriented on trach/vent P:  fent prn add  Fuller Planhristopher W Rice, MD PGY-I Internal Medicine Resident Pager# [phone] 05/12/2015, 8:12 AM   STAFF NOTE: Cindi CarbonI, Daniel Feinstein, MD FACP have personally reviewed patient's available data, including medical history, events of note, physical examination and test results as part of my evaluation. I have discussed with resident/NP and other care  providers such as pharmacist, RN and RRT. In addition, I personally evaluated patient and elicited key findings of: septic shock likely, axilla source is concern?, ABG reviewed slight increase rate, I am unimpressed for PNA hcap, will re echo him to assess volume status and pa pressures now?, cvp accuracy?, bolus and assess response, pcxr not sure pulm edema, has small ung volumes etc and vent needs low, levo vaso to map goals, add stress roids, high risk res organisms, change tio Imipenem follow for reaction, would benefit from better coverage with continued pressor needs, continued vanc, call surgery The patient is critically ill with multiple organ systems failure and requires high complexity decision making for assessment and support, frequent evaluation and titration of therapies, application of advanced monitoring technologies and extensive interpretation of multiple databases.   Critical Care Time  devoted to patient care services described in this note is45 Minutes. This time reflects time of care of this signee: Rory Percy, MD FACP. This critical care time does not reflect procedure time, or teaching time or supervisory time of PA/NP/Med student/Med Resident etc but could involve care discussion time. Rest per NP/medical resident whose note is outlined above and that I agree with   Mcarthur Rossetti. Tyson Alias, MD, FACP Pgr: [phone] [facility] 05/12/2015 8:18 AM

## 2015-05-12 NOTE — Progress Notes (Signed)
CRITICAL VALUE ALERT  Critical value received:  Calcium 6.3  Date of notification:  05/12/15  Time of notification: 1900  Critical value read back:Yes.    Nurse who received alert:  Edison PaceAshley Aldahir Litaker  MD notified (1st page):  Dr. Dimple Caseyice

## 2015-05-12 NOTE — Procedures (Signed)
Arterial Catheter Insertion Procedure Note Daniel SouthwardDanny Maynard [id number] 10/23/1955  Procedure: Insertion of Arterial Catheter  Indications: Blood pressure monitoring  Procedure Details Consent: Risks of procedure as well as the alternatives and risks of each were explained to the (patient/caregiver).  Consent for procedure obtained. Time Out: Verified patient identification, verified procedure, site/side was marked, verified correct patient position, special equipment/implants available, medications/allergies/relevent history reviewed, required imaging and test results available.  Performed  Maximum sterile technique was used including antiseptics, cap, gloves, gown, hand hygiene, mask and sheet. Skin prep: Chlorhexidine; local anesthetic administered 20 gauge catheter was inserted into left radial artery using the Seldinger technique.  Evaluation Blood flow good; BP tracing good. Complications: No apparent complications.   Sergio Hobart, Thelma BargeFrancis 05/12/2015

## 2015-05-12 NOTE — Progress Notes (Signed)
eLink Physician-Brief Progress Note Patient Name: Daniel SouthwardDanny Kramlich DOB: 06/08/1955 MRN: [id number]   Date of Service  05/12/2015  HPI/Events of Note  Reviewed CT scan of the abdomen.  eICU Interventions  No bowel obstruction. No evidence of acute cholecystitis. Possible mild acute pancreatitis. Lower lobe atelectasis.     Intervention Category Intermediate Interventions: Abdominal pain - evaluation and management;Diagnostic test evaluation  Lawanda CousinsJennings Kary Sugrue 05/12/2015, 7:49 PM

## 2015-05-12 NOTE — Progress Notes (Signed)
Order placed for Cotrak tube. I was unable to pass tube down either nare due to anatomy. The dietician attempted as well, but encountered same result.   MD Rice made aware. Order also in for CT with contrast. MD Rice made aware that CT will be on hold until a route for PO contrast is obtained

## 2015-05-12 NOTE — Care Management Note (Signed)
Case Management Note  Patient Details  Name: Ulyses SouthwardDanny Coultas MRN: [id number] Date of Birth: 11/17/1955  Subjective/Objective:     [facility] and will return when medically stable.               Expected Discharge Plan:  Skilled Nursing Facility  In-House Referral:  Clinical Social Work  Status of Service:  Completed, signed off  Hudsen Fei, TrentonHenrietta T, CaliforniaRN 05/12/2015, 1:00 PM

## 2015-05-12 NOTE — Progress Notes (Signed)
Pt. transported from ED Trauma-C to 2MW-05.

## 2015-05-12 NOTE — Progress Notes (Signed)
ANTICOAGULATION CONSULT NOTE - Follow Up Consult  Pharmacy Consult for heparin Indication: atrial fibrillation  Allergies  Allergen Reactions  . Hydrocodone     Unknown  . Morphine And Related     unknown  . Zosyn [Piperacillin Sod-Tazobactam So]     Unknown    Patient Measurements: Height: 5\' 6"  (167.6 cm) Weight: (!) 339 lb 1.1 oz (153.8 kg) IBW/kg (Calculated) : 63.8 Heparin Dosing Weight: 102 kg  Vital Signs: Temp: 100 F (37.8 C) (01/05 1920) Temp Source: Oral (01/05 1920) BP: 124/62 mmHg (01/05 1930) Pulse Rate: 87 (01/05 1930)  Labs:  Recent Labs  10-Jun-2015 2138  05/12/15 0530 05/12/15 1000 05/12/15 1205 05/12/15 1556 05/12/15 1557 05/12/15 2130  HGB 10.9*  --  10.9*  --   --   --   --   --   HCT 34.5*  --  34.1*  --   --   --   --   --   PLT 130*  --  118*  --   --   --   --   --   HEPARINUNFRC  --   --   --   --  0.12*  --   --  0.17*  CREATININE 4.58*  --  4.18* 3.65*  --  3.68*  --   --   TROPONINI  --   < > 0.08* 0.09*  --   --  0.09*  --   < > = values in this interval not displayed.  Estimated Creatinine Clearance: 30.9 mL/min (by C-G formula based on Cr of 3.68).   Assessment: 60 yo M admitted 05/18/2015 from [facility] with low urine output, fever and lethargy. Patient started on heparin for Afib.   HL 0.17 remains sub-therapeutic on 1900 units/hr , no s/sx of bleeding noted.  Goal of Therapy:  Heparin level 0.3-0.7 units/ml Monitor platelets by anticoagulation protocol: Yes   Plan:  rebolus 2000 units bolus x 1 Increase heparin infusion to 2200 units/hr Check anti-Xa level in 8 hours at 0700 Continue to monitor H&H and platelets  Bayard HuggerMei Shenea Giacobbe, PharmD, BCPS  Clinical Pharmacist  Pager: [phone]   05/12/2015,10:50 PM

## 2015-05-12 NOTE — Progress Notes (Signed)
ANTICOAGULATION CONSULT NOTE - Follow Up Consult  Pharmacy Consult for heparin Indication: atrial fibrillation  Allergies  Allergen Reactions  . Hydrocodone     Unknown  . Morphine And Related     unknown  . Zosyn [Piperacillin Sod-Tazobactam So]     Unknown    Patient Measurements: Height: 5\' 6"  (167.6 cm) Weight: (!) 339 lb 1.1 oz (153.8 kg) IBW/kg (Calculated) : 63.8 Heparin Dosing Weight: 102 kg  Vital Signs: Temp: 100.6 F (38.1 C) (01/05 1200) Temp Source: Oral (01/05 1200) BP: 103/75 mmHg (01/05 1200) Pulse Rate: 85 (01/05 1230)  Labs:  Recent Labs  06/04/2015 2138 05/12/15 0004 05/12/15 0530 05/12/15 1000 05/12/15 1205  HGB 10.9*  --  10.9*  --   --   HCT 34.5*  --  34.1*  --   --   PLT 130*  --  118*  --   --   HEPARINUNFRC  --   --   --   --  0.12*  CREATININE 4.58*  --  4.18* 3.65*  --   TROPONINI  --  0.08* 0.08* 0.09*  --     Estimated Creatinine Clearance: 31.1 mL/min (by C-G formula based on Cr of 3.65).   Assessment: 60 yo M admitted 05/30/2015 from [facility] with low urine output, fever and lethargy. Patient started on heparin for Afib.   HL 0.12 (sub-therapeutic), H/H stable, Plt 118, no s/sx of bleeding noted.  Goal of Therapy:  Heparin level 0.3-0.7 units/ml Monitor platelets by anticoagulation protocol: Yes   Plan:  Give 3000 units bolus x 1 Start heparin infusion at 1900 units/hr Check anti-Xa level in 8 hours and daily while on heparin Continue to monitor H&H and platelets  Casilda Carlsaylor Shyniece Scripter, PharmD. PGY-1 Pharmacy Resident Pager: [phone] 05/12/2015,12:39 PM

## 2015-05-13 ENCOUNTER — Inpatient Hospital Stay (HOSPITAL_COMMUNITY): Payer: Medicare Other

## 2015-05-13 DIAGNOSIS — I9589 Other hypotension: Secondary | ICD-10-CM

## 2015-05-13 LAB — BASIC METABOLIC PANEL
Anion gap: 12 (ref 5–15)
BUN: 88 mg/dL — AB (ref 6–20)
CHLORIDE: 106 mmol/L (ref 101–111)
CO2: 21 mmol/L — ABNORMAL LOW (ref 22–32)
CREATININE: 2.96 mg/dL — AB (ref 0.61–1.24)
Calcium: 6 mg/dL — CL (ref 8.9–10.3)
GFR calc Af Amer: 25 mL/min — ABNORMAL LOW (ref >60)
GFR calc non Af Amer: 22 mL/min — ABNORMAL LOW (ref >60)
Glucose, Bld: 164 mg/dL — ABNORMAL HIGH (ref 65–99)
Potassium: 4.3 mmol/L (ref 3.5–5.1)
SODIUM: 139 mmol/L (ref 135–145)

## 2015-05-13 LAB — GLUCOSE, CAPILLARY
GLUCOSE-CAPILLARY: 140 mg/dL — AB (ref 65–99)
GLUCOSE-CAPILLARY: 133 mg/dL — AB (ref 65–99)
GLUCOSE-CAPILLARY: 132 mg/dL — AB (ref 65–99)
Glucose-Capillary: 106 mg/dL — ABNORMAL HIGH (ref 65–99)
Glucose-Capillary: 130 mg/dL — ABNORMAL HIGH (ref 65–99)
Glucose-Capillary: 135 mg/dL — ABNORMAL HIGH (ref 65–99)
Glucose-Capillary: 150 mg/dL — ABNORMAL HIGH (ref 65–99)
Glucose-Capillary: 152 mg/dL — ABNORMAL HIGH (ref 65–99)

## 2015-05-13 LAB — PROCALCITONIN: PROCALCITONIN: 25.63 ng/mL

## 2015-05-13 LAB — CBC
HCT: 32.4 % — ABNORMAL LOW (ref 39.0–52.0)
Hemoglobin: 10.5 g/dL — ABNORMAL LOW (ref 13.0–17.0)
MCH: 26.4 pg (ref 26.0–34.0)
MCHC: 32.4 g/dL (ref 30.0–36.0)
MCV: 81.6 fL (ref 78.0–100.0)
PLATELETS: 93 10*3/uL — AB (ref 150–400)
RBC: 3.97 MIL/uL — ABNORMAL LOW (ref 4.22–5.81)
RDW: 23.4 % — AB (ref 11.5–15.5)
WBC: 2.4 10*3/uL — AB (ref 4.0–10.5)

## 2015-05-13 LAB — MAGNESIUM: MAGNESIUM: 1.6 mg/dL — AB (ref 1.7–2.4)

## 2015-05-13 LAB — PHOSPHORUS: PHOSPHORUS: 4.5 mg/dL (ref 2.5–4.6)

## 2015-05-13 LAB — HEPARIN LEVEL (UNFRACTIONATED): HEPARIN UNFRACTIONATED: 0.17 [IU]/mL — AB (ref 0.30–0.70)

## 2015-05-13 LAB — C DIFFICILE QUICK SCREEN W PCR REFLEX
C DIFFICLE (CDIFF) ANTIGEN: NEGATIVE
C Diff interpretation: NEGATIVE
C Diff toxin: NEGATIVE

## 2015-05-13 LAB — URINE CULTURE: CULTURE: NO GROWTH

## 2015-05-13 LAB — VANCOMYCIN, RANDOM: VANCOMYCIN RM: 29 ug/mL

## 2015-05-13 MED ORDER — SODIUM CHLORIDE 0.9 % IV SOLN
1.0000 g | Freq: Once | INTRAVENOUS | Status: AC
  Administered 2015-05-13: 1 g via INTRAVENOUS
  Filled 2015-05-13: qty 10

## 2015-05-13 MED ORDER — HYDROCORTISONE NA SUCCINATE PF 100 MG IJ SOLR
50.0000 mg | Freq: Four times a day (QID) | INTRAMUSCULAR | Status: AC
  Administered 2015-05-13 – 2015-05-14 (×3): 50 mg via INTRAVENOUS
  Filled 2015-05-13 (×2): qty 2

## 2015-05-13 MED ORDER — LEVOFLOXACIN IN D5W 750 MG/150ML IV SOLN
750.0000 mg | INTRAVENOUS | Status: DC
  Administered 2015-05-13: 750 mg via INTRAVENOUS
  Filled 2015-05-13 (×2): qty 150

## 2015-05-13 MED ORDER — HEPARIN BOLUS VIA INFUSION
3000.0000 [IU] | Freq: Once | INTRAVENOUS | Status: DC
  Filled 2015-05-13: qty 3000

## 2015-05-13 MED ORDER — SODIUM CHLORIDE 0.9 % IV SOLN: 250.0000 mL | INTRAVENOUS | Status: DC

## 2015-05-13 MED ORDER — METRONIDAZOLE IN NACL 5-0.79 MG/ML-% IV SOLN
500.0000 mg | Freq: Three times a day (TID) | INTRAVENOUS | Status: DC
  Administered 2015-05-13 – 2015-05-14 (×3): 500 mg via INTRAVENOUS
  Filled 2015-05-13 (×5): qty 100

## 2015-05-13 MED ORDER — SODIUM CHLORIDE 0.9 % IV SOLN: 250.0000 mL | INTRAVENOUS | Status: DC | PRN

## 2015-05-13 MED ORDER — HYDROCORTISONE NA SUCCINATE PF 100 MG IJ SOLR: 50.0000 mg | Freq: Two times a day (BID) | INTRAMUSCULAR | Status: DC

## 2015-05-13 MED ORDER — HYDROCORTISONE NA SUCCINATE PF 100 MG IJ SOLR: 50.0000 mg | Freq: Every day | INTRAMUSCULAR | Status: DC

## 2015-05-13 MED ORDER — MAGNESIUM SULFATE 2 GM/50ML IV SOLN
2.0000 g | Freq: Once | INTRAVENOUS | Status: AC
  Administered 2015-05-13: 2 g via INTRAVENOUS
  Filled 2015-05-13: qty 50

## 2015-05-13 MED ORDER — HYDROCORTISONE NA SUCCINATE PF 100 MG IJ SOLR
50.0000 mg | Freq: Three times a day (TID) | INTRAMUSCULAR | Status: AC
  Administered 2015-05-14 – 2015-05-15 (×3): 50 mg via INTRAVENOUS
  Filled 2015-05-13 (×3): qty 2

## 2015-05-13 NOTE — Progress Notes (Addendum)
ANTICOAGULATION CONSULT NOTE - Follow Up Consult  Pharmacy Consult for heparin/vancomycin/imipenem Indication: atrial fibrillation  Allergies  Allergen Reactions  . Hydrocodone     Unknown  . Morphine And Related     unknown  . Zosyn [Piperacillin Sod-Tazobactam So]     Unknown    Patient Measurements: Height: 5\' 6"  (167.6 cm) Weight: (!) 339 lb 1.1 oz (153.8 kg) IBW/kg (Calculated) : 63.8 Heparin Dosing Weight: 102 kg  Vital Signs: Temp: 99.1 F (37.3 C) (01/06 0856) Temp Source: Oral (01/06 0856) BP: 105/79 mmHg (01/06 0900) Pulse Rate: 77 (01/06 0900)  Labs:  Recent Labs  05/17/2015 2138  05/12/15 0530 05/12/15 1000 05/12/15 1205 05/12/15 1556 05/12/15 1557 05/12/15 2130 05/13/15 0515 05/13/15 0735  HGB 10.9*  --  10.9*  --   --   --   --   --  10.5*  --   HCT 34.5*  --  34.1*  --   --   --   --   --  32.4*  --   PLT 130*  --  118*  --   --   --   --   --  93*  --   HEPARINUNFRC  --   --   --   --  0.12*  --   --  0.17*  --  0.17*  CREATININE 4.58*  --  4.18* 3.65*  --  3.68*  --   --  2.96*  --   TROPONINI  --   < > 0.08* 0.09*  --   --  0.09*  --   --   --   < > = values in this interval not displayed.  Estimated Creatinine Clearance: 38.4 mL/min (by C-G formula based on Cr of 2.96).   Assessment: 60 yo M admitted 05/15/2015 from [facility] with low urine output, fever and lethargy. Patient started on heparin for Afib. He continues to be sub-therapeutic despite rate increases.  HL 0.17 (subtherapeutic), H/H stable, Plt wnl, No s/sx of bleeding noted. Vanc random 29, SCr improved 2.96 (BL=1.4), CrCl ~38 ml/min  Goal of Therapy:  Heparin level 0.3-0.7 units/ml Monitor platelets by anticoagulation protocol: Yes   Plan:  Give 3000 units bolus x 1 Start heparin infusion at 2500 units/hr Check anti-Xa level in 6 hours and daily while on heparin Continue to monitor H&H and platelets Check AT-3 tomorrow morning  Continue to hold vancomycin doses Check  another random level in the morning Monitor renal function and clinical progression  Casilda Carlsaylor Charita Lindenberger, PharmD. PGY-1 Pharmacy Resident Pager: [phone] 05/13/2015,9:25 AM

## 2015-05-13 NOTE — Progress Notes (Addendum)
Dr. Otis BraceMarjan Maynard was notified of pt Ca++ of 6.0. Orders given, see mar

## 2015-05-13 NOTE — Progress Notes (Signed)
Cortrak Tube Team Note:  Cortrak tube ordered 1/5, team unable to place tube as documented by team 1/5.  Spoke with MD this am, plan for swallow eval today.  Discontinue order for Cortrak tube.   Kendell BaneHeather Navraj Dreibelbis RD, LDN, CNSC [phone] Pager [phone] After Hours Pager

## 2015-05-13 NOTE — Progress Notes (Signed)
Report called to RN on 2C. Pt is stable for transport.

## 2015-05-13 NOTE — Progress Notes (Signed)
PULMONARY / CRITICAL CARE MEDICINE   Name: Daniel Maynard MRN: [id number] DOB: 11/26/1955    ADMISSION DATE:  05/21/2015  REFERRING MD : Dr. Clarene Duke   SUBJECTIVE: Urine output increasing, rectal tube placed due to 5-6 episodes of loose diarrhea during the day yesterday without any blood/melena  VITAL SIGNS: Temp:  [98.4 F (36.9 C)-100.6 F (38.1 C)] 98.4 F (36.9 C) (01/06 0333) Pulse Rate:  [68-90] 82 (01/06 0600) Resp:  [16-32] 27 (01/06 0600) BP: (97-133)/(48-109) 104/73 mmHg (01/06 0600) SpO2:  [95 %-100 %] 99 % (01/06 0600) Arterial Line BP: (90-134)/(41-74) 103/52 mmHg (01/06 0606) FiO2 (%):  [50 %-55 %] 50 % (01/06 0330) HEMODYNAMICS: CVP:  [5 mmHg-17 mmHg] 10 mmHg VENTILATOR SETTINGS: Vent Mode:  [-] PRVC FiO2 (%):  [50 %-55 %] 50 % Set Rate:  [20 bmp-22 bmp] 22 bmp Vt Set:  [500 mL] 500 mL PEEP:  [5 cmH20] 5 cmH20 Plateau Pressure:  [21 cmH20-23 cmH20] 22 cmH20 INTAKE / OUTPUT: Intake/Output      01/05 0701 - 01/06 0700 01/06 0701 - 01/07 0700   P.O. 650    I.V. (mL/kg) 4440.4 (28.9)    IV Piggyback 1200    Total Intake(mL/kg) 6290.4 (40.9)    Urine (mL/kg/hr) 1150 (0.3)    Stool 0 (0)    Total Output 1150     Net +5140.4          Urine Occurrence 2 x    Stool Occurrence 4 x       PHYSICAL EXAMINATION: General:  Morbidly obese male with a tracheostomy collar (on vent) Neuro:  Awake, alert and following verbal commands appropriately, unable to vocalize 2/2 trach, ETT, moving upper extremities HEENT: PERRL and EOMI. Moist mucous membranes Cardiovascular:  RRR.  No murmurs, rubs, or gallops.  Lungs:  Distant breath sounds Abdomen: Soft. Positive bowel sounds. Mildly tender to palpation Musculoskeletal:  1+ edema of bilateral lower extremities Skin: Hidradenitis with drainage from R axilla and scrotum without surrounding erythema/fluctuance  LABS:  CBC  Recent Labs Lab 05/10/2015 2138 05/12/15 0530 05/13/15 0515  WBC 7.9 6.8 2.4*  HGB 10.9* 10.9* 10.5*   HCT 34.5* 34.1* 32.4*  PLT 130* 118* 93*   Coag's No results for input(s): APTT, INR in the last 168 hours. BMET  Recent Labs Lab 05/12/15 1000 05/12/15 1556 05/13/15 0515  NA 138 136 139  K 4.1 4.5 4.3  CL 107 103 106  CO2 19* 18* 21*  BUN 88* 89* 88*  CREATININE 3.65* 3.68* 2.96*  GLUCOSE 145* 170* 164*   Electrolytes  Recent Labs Lab 05/12/15 0530 05/12/15 1000 05/12/15 1556 05/13/15 0515  CALCIUM 6.5* 6.0* 6.3* 6.0*  MG 1.4*  --   --  1.6*  PHOS 5.8*  --   --  4.5   Sepsis Markers  Recent Labs Lab 05/12/2015 2136 05/12/15 0003 05/12/15 0530  LATICACIDVEN 2.65* 2.1*  --   PROCALCITON  --   --  23.30   ABG  Recent Labs Lab 06/01/2015 2049 05/12/15 0603  PHART 7.384 7.303*  PCO2ART 41.3 43.1  PO2ART 53.0* 71.0*   Liver Enzymes  Recent Labs Lab 05/31/2015 2138  AST 55*  ALT 30  ALKPHOS 118  BILITOT 1.1  ALBUMIN 1.6*   Cardiac Enzymes  Recent Labs Lab 05/12/15 0530 05/12/15 1000 05/12/15 1557  TROPONINI 0.08* 0.09* 0.09*   Glucose  Recent Labs Lab 05/12/15 0742 05/12/15 1208 05/12/15 1539 05/12/15 1921 05/13/15 0023 05/13/15 0330  GLUCAP 118* 137* 162* 160*  152* 140*    Imaging Ct Abdomen Pelvis Wo Contrast  05/12/2015  CLINICAL DATA:  Abdominal distention EXAM: CT ABDOMEN AND PELVIS WITHOUT CONTRAST TECHNIQUE: Multidetector CT imaging of the abdomen and pelvis was performed following the standard protocol without IV contrast. COMPARISON:  None. FINDINGS: Lower chest: Patchy bilateral lower lobe opacities, possibly compressive atelectasis, bilateral lower lobe pneumonia not excluded. Cardiomegaly.  Pacemaker wires, incompletely visualized. Hepatobiliary: Liver is grossly unremarkable. Multiple gallstones, measuring up to 2.8 cm (series 2/ image 34). No associated inflammatory changes. Pancreas: Mild peripancreatic stranding along the pancreatic tail (series 2/image 28), possibly reflecting mild acute pancreatitis, although this is not  concordant with laboratory evaluation. Spleen: Within normal limits. Adrenals/Urinary Tract: Adrenal glands are within normal limits. 1.6 cm cyst in the lateral left upper kidney (series 2/ image 37). Right kidney is unremarkable. No renal, ureteral, or bladder calculi. No hydronephrosis. Bladder is notable for an indwelling Foley catheter and nondependent gas. Stomach/Bowel: Stomach is notable for an enteric tube which terminates in the distal gastric body. No evidence of bowel obstruction. Fluid within the left colon, raising the possibility of a diarrheal process. Nondependent gas within the transverse colon. Vascular/Lymphatic: Atherosclerotic calcifications of the abdominal aorta and branch vessels. No evidence of abdominal aortic aneurysm. Small upper abdominal/ retroperitoneal lymph nodes, including a 13 mm short axis portacaval node (series 2/ image 34) and a 9 mm short axis left para-aortic node (series 2/ image 46), likely reactive. Reproductive: Prostate is unremarkable. Other: No abdominopelvic ascites. Musculoskeletal: Visualized osseous structures are within normal limits. IMPRESSION: No evidence of bowel obstruction. Fluid within the left colon, raising the possibility of a diarrheal process. Cholelithiasis, without associated findings to suggest acute cholecystitis. Mild peripancreatic stranding along the pancreatic tail, possibly reflecting mild acute pancreatitis, although this is not concordant with laboratory evaluation. Patchy bilateral lower lobe opacities, possibly compressive atelectasis, bilateral lower lobe pneumonia not excluded. Additional ancillary findings as above. Electronically Signed   By: Charline Bills M.D.   On: 05/12/2015 18:40   Dg Abd 1 View  05/12/2015  CLINICAL DATA:  Abdominal distention. EXAM: ABDOMEN - 1 VIEW COMPARISON:  05/02/2015 . FINDINGS: The stomach and colon appear to be severely distended. These findings may be related to ileus. If these changes do not  resolve CT abdomen pelvis can be obtained to exclude bowel obstruction including colonic obstruction . No acute bony abnormality. No free air identified on KUB. Three view of the abdomen can be obtained for further evaluation as needed IMPRESSION: Prominent gastric and colonic distention. Electronically Signed   By: Maisie Fus  Register   On: 05/12/2015 09:59   US Scrotum  05/13/2015  CLINICAL DATA:  Acute onset of scrotal swelling.  Initial encounter. EXAM: ULTRASOUND OF SCROTUM TECHNIQUE: Complete ultrasound examination of the testicles, epididymis, and other scrotal structures was performed. COMPARISON:  None. FINDINGS: Right testicle Measurements: 3.0 x 1.7 x 2.9 cm. No mass or microlithiasis visualized. Left testicle Measurements: 3.6 x 1.9 x 2.5 cm. No mass or microlithiasis visualized. Right epididymis:  Not seen due to overlying soft tissue edema. Left epididymis:  Not seen due to overlying soft tissue edema. Hydrocele:  None visualized. Varicocele:  None visualized. Diffuse scrotal wall edema is noted, measuring up to 2.6 cm. Limited color Doppler evaluation of both testes demonstrates no evidence for testicular torsion. IMPRESSION: Extensive scrotal wall edema noted. Testes grossly unremarkable in appearance. No evidence of testicular torsion. Electronically Signed   By: Roanna Raider M.D.   On: 05/13/2015 04:17  Dg Chest Port 1 View  05/12/2015  CLINICAL DATA:  Central line placement. EXAM: PORTABLE CHEST 1 VIEW COMPARISON:  Yesterday at 2124 hour FINDINGS: New left internal jugular central venous catheter tip in the region of a proximal SVC. Tracheostomy tube remains the thoracic inlet. Left-sided pacemaker in place. No evidence of pneumothorax. Multi focal bilateral patchy opacities of mildly progressed in the interim. Question of developing pleural effusions with hazy opacity at the lung bases. Cardiomegaly is stable. IMPRESSION: 1. Tip of the left central line in the proximal SVC. No evidence of  pneumothorax. 2. Worsening lung aeration with multifocal bilateral patchy opacities. Developing hazy opacity at the lung bases, likely pleural effusions. Multifocal pneumonia versus CHF. Electronically Signed   By: Rubye Oaks M.D.   On: 05/12/2015 03:15   Dg Chest Port 1 View  05/17/2015  CLINICAL DATA:  Fever and lethargy for 1 day EXAM: PORTABLE CHEST 1 VIEW COMPARISON:  May 02, 2015 FINDINGS: Tracheostomy catheter tip is 3.0 cm above the carina. Pacemaker leads are attached to the right atrium right ventricle. No pneumothorax. There is patchy infiltrate in the left mid lung, right mid lung, and right base regions. Lungs elsewhere clear. Heart is mildly enlarged with pulmonary vascularity within normal limits. No adenopathy. No bone lesions. IMPRESSION: Patchy infiltrate in the left mid lung, right mid lung and right base regions, consistent with multifocal pneumonia. Stable cardiac prominence. Tracheostomy catheter as described without pneumothorax. Electronically Signed   By: Bretta Bang III M.D.   On: 05/12/2015 21:46   Antibiotics Vancomycin 1/4>>> Aztreonam 1/4>>> Levaquin 1/4>>>  Cultures Blood 1/4>>> Urine 1/4>>> Wound 1/5>>>  CXR: Patchy infiltrate in the left midlung, right midlung and right base regions, consistent with multifocal pneumonia.  ASSESSMENT / PLAN:  PULMONARY A: CHF vs multifocal HCAP OHS/atelectasis (?) P:   continue ventilator support  ABX per ID DuoNeb every 6 hours Pulmicort neb BID Daily CXR cvp 10, kvo Vent rate 22 Attempt TC, if fails cpap 5 ps 5-10  CARDIOVASCULAR A: sCHF Hx, but TTE showing normal LVEF 65-70%. HTN Hypotensive resolved with pos balance Adrenal insuff P:  Levophed weaned with MAP > 60 Dc aline Hold home medications Norvasc and Coreg in the setting of hypotension Hold home medication Torsemide in the setting of hypotension and kidney injury  Stress roids, tapper in am as just off pressors  RENAL A:  ATN in  the setting of severe sepsis (hypotension), hypovolemia for sure Oliguric AKI on CKD stage III Serum Cr 4.58 on admission now 2.96; baseline approximately 1.7. P:   kvo Bet to daily AM replace Mag, replace Ca Consider maintenance volume, cvp accuracy??  GASTROINTESTINAL A:  GI prophylaxis Diarrhea, Pending C. Diff PCR Gastroenteritis?  P:    Enteric precautions Famotidine for SUP Pos balance Send GI panel slp on TC  HEMATOLOGIC A: Normocytic anemia likely secondary to chronic kidney disease, stable DVT prophylaxis WBC down, Plts down, dilutional? sepsis P:  Heparin continuous infusion for Afib, hold with plat drop Repeat AM CBC  INFECTIOUS A: HCAP (unlikely) vs CHF infiltrates, atelectasis noted on bases w/ CT Blood, urine Cx pending Consider GI source with extensive diarrhea, distended bowel - enteritis P:  Vancomycin dc Aztreonam dc Add enterics flagyl, levofloxacin Tylnol suppository as needed for fevers F/U urine culture F/U blood cultures  Send GI panel  ENDOCRINE A: DM 2 AI P:   Monitor CBGs sliding scale insulin stress roids reduce in am   NEUROLOGIC A: Alert and oriented on  trach/vent P:  fent prn Chair PT  Fuller Planhristopher W Rice, MD PGY-I Internal Medicine Resident Pager# [phone] 05/13/2015, 7:01 AM  STAFF NOTE: I, Rory Percyaniel Feinstein, MD FACP have personally reviewed patient's available data, including medical history, events of note, physical examination and test results as part of my evaluation. I have discussed with resident/NP and other care providers such as pharmacist, RN and RRT. In addition, I personally evaluated patient and elicited key findings of: more awake, off pressors, obese, trach site clean, abdomen better less distention, clinical course likely gastroenteritis, maintain pos balance, responding to volume well, change ABX to levo flagyl, consider 5 days, send GI panel, chem in am , dc line if able, dc aline, trach collar  goals, slp on trach collar if successful and cuff down The patient is critically ill with multiple organ systems failure and requires high complexity decision making for assessment and support, frequent evaluation and titration of therapies, application of advanced monitoring technologies and extensive interpretation of multiple databases.   Critical Care Time devoted to patient care services described in this note is 30 Minutes. This time reflects time of care of this signee: Rory Percyaniel Feinstein, MD FACP. This critical care time does not reflect procedure time, or teaching time or supervisory time of PA/NP/Med student/Med Resident etc but could involve care discussion time. Rest per NP/medical resident whose note is outlined above and that I agree with   Mcarthur Rossettianiel J. Tyson AliasFeinstein, MD, FACP Pgr: [phone] [facility] 05/13/2015 10:24 AM

## 2015-05-13 NOTE — Progress Notes (Signed)
MAP goal >=60 met, all pressors off at this time 

## 2015-05-14 DIAGNOSIS — J9621 Acute and chronic respiratory failure with hypoxia: Secondary | ICD-10-CM | POA: Insufficient documentation

## 2015-05-14 DIAGNOSIS — J810 Acute pulmonary edema: Secondary | ICD-10-CM | POA: Insufficient documentation

## 2015-05-14 LAB — GASTROINTESTINAL PANEL BY PCR, STOOL (REPLACES STOOL CULTURE)
ASTROVIRUS: NOT DETECTED
Adenovirus F40/41: NOT DETECTED
CAMPYLOBACTER SPECIES: NOT DETECTED
Cryptosporidium: NOT DETECTED
Cyclospora cayetanensis: NOT DETECTED
E. COLI O157: NOT DETECTED
ENTEROTOXIGENIC E COLI (ETEC): NOT DETECTED
Entamoeba histolytica: NOT DETECTED
Enteroaggregative E coli (EAEC): NOT DETECTED
Enteropathogenic E coli (EPEC): NOT DETECTED
Giardia lamblia: NOT DETECTED
NOROVIRUS GI/GII: NOT DETECTED
PLESIMONAS SHIGELLOIDES: NOT DETECTED
ROTAVIRUS A: NOT DETECTED
SALMONELLA SPECIES: NOT DETECTED
SAPOVIRUS (I, II, IV, AND V): NOT DETECTED
SHIGA LIKE TOXIN PRODUCING E COLI (STEC): NOT DETECTED
SHIGELLA/ENTEROINVASIVE E COLI (EIEC): NOT DETECTED
VIBRIO CHOLERAE: NOT DETECTED
Vibrio species: NOT DETECTED
Yersinia enterocolitica: NOT DETECTED

## 2015-05-14 LAB — URINALYSIS, ROUTINE W REFLEX MICROSCOPIC
Glucose, UA: NEGATIVE mg/dL
KETONES UR: 15 mg/dL — AB
Nitrite: POSITIVE — AB
PROTEIN: 100 mg/dL — AB
Specific Gravity, Urine: 1.027 (ref 1.005–1.030)
pH: 5 (ref 5.0–8.0)

## 2015-05-14 LAB — EXPECTORATED SPUTUM ASSESSMENT W GRAM STAIN, RFLX TO RESP C: Special Requests: NORMAL

## 2015-05-14 LAB — BASIC METABOLIC PANEL
Anion gap: 15 (ref 5–15)
BUN: 96 mg/dL — AB (ref 6–20)
CHLORIDE: 106 mmol/L (ref 101–111)
CO2: 20 mmol/L — AB (ref 22–32)
CREATININE: 2.99 mg/dL — AB (ref 0.61–1.24)
Calcium: 5.3 mg/dL — CL (ref 8.9–10.3)
GFR calc Af Amer: 25 mL/min — ABNORMAL LOW (ref >60)
GFR calc non Af Amer: 22 mL/min — ABNORMAL LOW (ref >60)
GLUCOSE: 130 mg/dL — AB (ref 65–99)
Potassium: 4.8 mmol/L (ref 3.5–5.1)
Sodium: 141 mmol/L (ref 135–145)

## 2015-05-14 LAB — URINE MICROSCOPIC-ADD ON

## 2015-05-14 LAB — GLUCOSE, CAPILLARY
GLUCOSE-CAPILLARY: 135 mg/dL — AB (ref 65–99)
Glucose-Capillary: 115 mg/dL — ABNORMAL HIGH (ref 65–99)
Glucose-Capillary: 125 mg/dL — ABNORMAL HIGH (ref 65–99)
Glucose-Capillary: 135 mg/dL — ABNORMAL HIGH (ref 65–99)
Glucose-Capillary: 120 mg/dL — ABNORMAL HIGH (ref 65–99)
Glucose-Capillary: 126 mg/dL — ABNORMAL HIGH (ref 65–99)

## 2015-05-14 LAB — CBC
HEMATOCRIT: 33.2 % — AB (ref 39.0–52.0)
Hemoglobin: 10.7 g/dL — ABNORMAL LOW (ref 13.0–17.0)
MCH: 26.8 pg (ref 26.0–34.0)
MCHC: 32.2 g/dL (ref 30.0–36.0)
MCV: 83.2 fL (ref 78.0–100.0)
PLATELETS: 71 10*3/uL — AB (ref 150–400)
RBC: 3.99 MIL/uL — ABNORMAL LOW (ref 4.22–5.81)
RDW: 24.2 % — AB (ref 11.5–15.5)
WBC: 1.5 10*3/uL — ABNORMAL LOW (ref 4.0–10.5)

## 2015-05-14 LAB — PROTIME-INR
INR: 3.51 — AB (ref 0.00–1.49)
PROTHROMBIN TIME: 34.4 s — AB (ref 11.6–15.2)

## 2015-05-14 LAB — PROCALCITONIN: Procalcitonin: 26.11 ng/mL

## 2015-05-14 LAB — EXPECTORATED SPUTUM ASSESSMENT W REFEX TO RESP CULTURE

## 2015-05-14 MED ORDER — SODIUM CHLORIDE 0.9 % IV SOLN
2000.0000 mg | INTRAVENOUS | Status: DC
Start: 2015-05-14 — End: 2015-05-16
  Administered 2015-05-14 – 2015-05-16 (×2): 2000 mg via INTRAVENOUS
  Filled 2015-05-14 (×2): qty 2000

## 2015-05-14 MED ORDER — SODIUM CHLORIDE 0.9 % IV SOLN: 250.0000 mL | INTRAVENOUS | Status: DC

## 2015-05-14 MED ORDER — ACETAMINOPHEN 650 MG RE SUPP
650.0000 mg | RECTAL | Status: DC | PRN
  Administered 2015-05-14: 650 mg via RECTAL
  Filled 2015-05-14: qty 1

## 2015-05-14 MED ORDER — SODIUM CHLORIDE 0.9 % IV SOLN
250.0000 mL | INTRAVENOUS | Status: DC
  Administered 2015-05-14: 250 mL via INTRAVENOUS

## 2015-05-14 MED ORDER — PANTOPRAZOLE SODIUM 40 MG IV SOLR
40.0000 mg | INTRAVENOUS | Status: DC
  Administered 2015-05-14 – 2015-05-16 (×3): 40 mg via INTRAVENOUS
  Filled 2015-05-14 (×3): qty 40

## 2015-05-14 MED ORDER — SODIUM CHLORIDE 0.9 % IV SOLN
2.0000 g | Freq: Once | INTRAVENOUS | Status: AC
  Administered 2015-05-14: 2 g via INTRAVENOUS
  Filled 2015-05-14: qty 20

## 2015-05-14 MED ORDER — LEVOFLOXACIN IN D5W 750 MG/150ML IV SOLN
750.0000 mg | INTRAVENOUS | Status: DC
  Administered 2015-05-15: 750 mg via INTRAVENOUS
  Filled 2015-05-14 (×3): qty 150

## 2015-05-14 MED ORDER — LEVOFLOXACIN IN D5W 750 MG/150ML IV SOLN: 750.0000 mg | INTRAVENOUS | Status: DC

## 2015-05-14 MED ORDER — ACETAMINOPHEN 10 MG/ML IV SOLN
1000.0000 mg | Freq: Four times a day (QID) | INTRAVENOUS | Status: AC | PRN
  Administered 2015-05-15: 1000 mg via INTRAVENOUS
  Filled 2015-05-14 (×4): qty 100

## 2015-05-14 NOTE — Progress Notes (Signed)
PULMONARY / CRITICAL CARE MEDICINE   Name: Daniel Maynard MRN: [id number] DOB: 11/27/1955    ADMISSION DATE:  05/20/2015  REFERRING MD : Dr. Clarene DukeLittle   SUBJECTIVE: Creatinine stable, no vent weaning, encephalopathic, still on full vent support, UOP OK  VITAL SIGNS: Temp:  [97.8 F (36.6 C)-102.4 F (39.1 C)] 100.6 F (38.1 C) (01/07 1929) Pulse Rate:  [75-107] 89 (01/07 1932) Resp:  [24-32] 27 (01/07 1932) BP: (78-123)/(44-106) 78/53 mmHg (01/07 1929) SpO2:  [97 %-99 %] 99 % (01/07 1932) FiO2 (%):  [40 %] 40 % (01/07 1932) Weight:  [157.852 kg (348 lb)] 157.852 kg (348 lb) (01/07 0430) HEMODYNAMICS:   VENTILATOR SETTINGS: Vent Mode:  [-] PRVC FiO2 (%):  [40 %] 40 % Set Rate:  [22 bmp] 22 bmp Vt Set:  [500 mL] 500 mL PEEP:  [5 cmH20] 5 cmH20 Plateau Pressure:  [21 cmH20-24 cmH20] 21 cmH20 INTAKE / OUTPUT: Intake/Output      01/07 0701 - 01/08 0700   I.V. (mL/kg)    IV Piggyback    Total Intake(mL/kg)    Urine (mL/kg/hr) 100 (0)   Total Output 100   Net -100          PHYSICAL EXAMINATION: General:  Morbidly obese HENT: Trach in place PULM: crackles bilaterally, vent supported breaths CV: RRR, distant heart sounds GI: BS+, soft nontender MSK: normal bulk and tone Neuro: encephalopatic  LABS: Reviewed, Cr stable, leukopenic  CXR images reviewed> persistent infiltrates, trach in place  Antibiotics Vancomycin 1/4>>>x1 day, restart 1/7 Aztreonam 1/4>>> x 1 day Imipenem 1/5 x1 day Levaquin 1/4, 1/6>>>  Flagyl 1/6>>  Cultures Blood 1/4>>> Urine 1/4>>> Wound 1/5>>> resp 1/7 >   ASSESSMENT / PLAN:  PULMONARY A: HCAP >seems most likely, agree with MRSA coverage Pulmonary edema? Possible, today he is about 4-5kg higher than when I last saw him and he was only on vent at night OHS/atelectasis > nocturnal vent Acute on chronic respiratory failure with hypoxemia> definitely worse than last admission, was very tachypneic on PSV 15/5 for me tonight P:   Would try  to wean on pressure support each day, even if 15 or 20/5, have discussed with RT tonight Continue full vent support at night Agree with current antibiotic coverage KVO fluids now, reassess UOP and BMET in AM Continue current antibiotic coverage Keep XLT trach in place   Will see again Monday unless needed sooner  Daniel CarolinaBrent Delania Ferg, MD [facility] Pager: [phone] Cell: [phone] After 3pm or if no response, call [phone]  05/14/2015 7:58 PM

## 2015-05-14 NOTE — Progress Notes (Signed)
SLP Cancellation Note  Patient Details Name: Daniel SouthwardDanny Maynard MRN: [id number] DOB: 01/06/1956   Cancelled treatment:       Reason Eval/Treat Not Completed: Medical issues which prohibited therapy  The patient was trialed on trach collar but had to go back on the vent.  ST will follow up next 1-2 days for possible swallow eval.    Dimas AguasMelissa Ramil [place]ging[place], MA, CCC-SLP Acute Rehab SLP [phone] Fleet ContrasGoodman, Annitta Fifield N 05/14/2015, 12:32 PM

## 2015-05-14 NOTE — Progress Notes (Signed)
[place] TEAM 1 - Stepdown/ICU TEAM PROGRESS NOTE  Daniel SouthwardDanny Maynard [id number] DOB: 09/12/1955 DOA: 05/25/2015 PCP: No primary care provider on file.  Admit HPI / Brief Narrative: 60 year old male with a history of diabetes,CHF, CKD, chronic respiratory failure with tracheostomy (nighttime ventilator use and daytime trach collar) who was [facility] for low urine output, fever, and lethargy.  Patient [facility] for acute respiratory failure with hypoxia and hypercarbia and fluid overload. He was also found to have acute encephalopathy likely from hypercarbia and respiratory acidosis which according to the discharge summary resolved prior to discharge.   Significant Events: 1/4 - [facility] 1/7 - [facility]   HPI/Subjective: The patient is alert but not conversant.  He will follow the examiner with his eyes but does not answer questions or follow simple commands.  He does not appear to be uncomfortable but is diaphoretic.  There is no evidence of respiratory distress at this time.  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  Trach care per PCCM - appears stable on vent at present   Reported hx of systolic CHF - TTE showing EF 81-19%65-70% +~11.5L thus far - admit weight 153.8kg - currently 157.8kg   Hypotension w/ hx of HTN Unclear if cuff readings are accurate - appears PCCM just stopped pressors yesterday - currently MAP 60 or > - follow  w/ IVF support   Newly appreciated Atrial flutter   Looks like PCCM ordered heparin, and then cancelled it for some reason - no mention of this problem or reasoning in their note - check 12 lead this morning - with plt count dropping below 100 will hold anticoag for now and follow on tele - assure TSH and Mg have been checked not having runs  Pancytopenia Has been developing during hospital stay - follow - unclear etiology presently, but a poor prognostic sign  Nutrition  Per notes the team was unable to place a Cortrak tube past nares - pt has no current means of nutrition - I will ask SLP to examine him but I suspect he will need to be NPO - will have to consider IR placed tube if unable to take orals   Adrenal insuff Cont to wean stress dose hydrocortisone - cortisol level was actually normal, but it is unclear if this was drawn prior to initiation of hydrocortisone    Oliguric AKI on CKD stage III ATN in the setting of severe sepsis - Cr 4.58 on admission - baseline approximately 1.7 - crt slowly improving   MRSA screen +  Normocytic anemia likely secondary to chronic kidney disease - Hgb holding steady for now   Hypomagnesemia Mg was 1.6 on 05/13/15 - replace today - f/u in AM   DM 2 CBG currently well controlled  Hidradenitis R axilla + scrotum Has been evaluated by Gen Surg who do not feel drainage is indicated at this time   Morbid obesity - Body  mass index is 56.2 kg/(m^2).  Code Status: FULL Family Communication: no family present at time of exam Disposition Plan: from [facility]  Consultants: PCCM Gen Surg   Antibiotics: Vancomycin 1/4 + 1/7 > Aztreonam 1/4  Imipenem 1/5 Levaquin 1/4 > Flagyl 1/6   DVT prophylaxis: SCDs  Objective: Blood pressure 88/54, pulse 78, temperature 102.3 F (39.1 C), temperature source Rectal, resp. rate 28, height 5\' 6"  (1.676 m), weight 157.852 kg (348 lb), SpO2 98 %.  Intake/Output Summary (Last 24 hours) at 05/14/15  0859 Last data filed at 05/14/15 0600  Gross per 24 hour  Intake    912 ml  Output    775 ml  Net    137 ml   Exam: General: No acute respiratory distress on vent via trach  Lungs: Poor air movement throughout all fields with no appreciable crackles or wheezes  Cardiovascular: Regular rate without murmur gallop or rub appreciable but w/ distant bs  Abdomen: morbidly obese - soft - no rebound - does not appear tender - bs appreciable  Extremities: No significant cyanosis, or clubbing - diffuse 1+ edema bilateral   Data Reviewed: Basic Metabolic Panel:  Recent Labs Lab 06/07/2015 2138 05/12/15 0530 05/12/15 1000 05/12/15 1556 05/13/15 0515  NA 138 138 138 136 139  K 4.5 4.2 4.1 4.5 4.3  CL 100* 103 107 103 106  CO2 24 21* 19* 18* 21*  GLUCOSE 97 129* 145* 170* 164*  BUN 92* 89* 88* 89* 88*  CREATININE 4.58* 4.18* 3.65* 3.68* 2.96*  CALCIUM 7.2* 6.5* 6.0* 6.3* 6.0*  MG  --  1.4*  --   --  1.6*  PHOS  --  5.8*  --   --  4.5    CBC:  Recent Labs Lab 05/17/2015 2138 05/12/15 0530 05/13/15 0515 05/14/15 0509  WBC 7.9 6.8 2.4* 1.5*  NEUTROABS 6.1  --   --   --   HGB 10.9* 10.9* 10.5* 10.7*  HCT 34.5* 34.1* 32.4* 33.2*  MCV 85.6 85.3 81.6 83.2  PLT 130* 118* 93* 71*    Liver Function Tests:  Recent Labs Lab 05/12/2015 2138  AST 55*  ALT 30  ALKPHOS 118  BILITOT 1.1  PROT 6.7  ALBUMIN 1.6*    Recent Labs Lab 05/12/15 1000  LIPASE 30  AMYLASE 37   Cardiac Enzymes:  Recent Labs Lab 05/12/15 0004 05/12/15 0530 05/12/15 1000 05/12/15 1557  TROPONINI 0.08* 0.08* 0.09* 0.09*    CBG:  Recent Labs Lab 05/13/15 2000 05/13/15 2055 05/14/15 0040 05/14/15 0424 05/14/15 0740  GLUCAP 135* 130* 125* 115* 135*    Recent Results (from the past 240 hour(s))  Urine culture     Status: None   Collection Time: 05/25/2015  9:02 PM  Result Value Ref Range Status   Specimen Description URINE, CATHETERIZED  Final   Special Requests NONE  Final   Culture NO  GROWTH 2 DAYS  Final   Report Status 05/13/2015 FINAL  Final  Culture, blood (routine x 2)     Status: None (Preliminary result)   Collection Time: 05/20/2015  9:25 PM  Result Value Ref Range Status   Specimen Description BLOOD RIGHT ANTECUBITAL  Final   Special Requests BOTTLES DRAWN AEROBIC AND ANAEROBIC 5CC  Final   Culture NO GROWTH 2 DAYS  Final   Report Status PENDING  Incomplete  Culture, blood (routine x 2)     Status: None (Preliminary result)   Collection Time: 05/21/2015  9:30 PM  Result Value Ref Range Status   Specimen Description BLOOD LEFT HAND  Final   Special Requests BOTTLES DRAWN AEROBIC AND ANAEROBIC 5CC  Final   Culture NO GROWTH 2 DAYS  Final   Report Status PENDING  Incomplete  MRSA PCR Screening     Status: Abnormal   Collection Time: 05/12/15  1:11 AM  Result Value Ref Range Status   MRSA by PCR POSITIVE (A) NEGATIVE Final    Comment:        The GeneXpert MRSA Assay (FDA approved for NASAL specimens only), is one component of a comprehensive MRSA colonization surveillance program. It is not intended to diagnose MRSA infection nor to guide or monitor treatment for MRSA infections. RESULT CALLED TO, READ BACK BY AND VERIFIED WITH: TOOMES,RN @0346  05/14/15 MKELLY   Wound culture     Status: None (Preliminary result)   Collection Time: 05/12/15  1:37 AM  Result Value Ref Range Status   Specimen Description ARM  Final   Special Requests RIGHT  Final   Gram Stain   Final    ABUNDANT WBC PRESENT,BOTH PMN AND MONONUCLEAR RARE SQUAMOUS EPITHELIAL CELLS PRESENT MODERATE GRAM NEGATIVE RODS FEW GRAM POSITIVE COCCI IN PAIRS IN CLUSTERS    Culture   Final    MULTIPLE ORGANISMS PRESENT, NONE PREDOMINANT Performed at [employer]    Report Status PENDING  Incomplete  C difficile quick scan w PCR reflex     Status: None   Collection Time: 05/12/15 10:00 PM  Result Value Ref Range Status   C Diff antigen NEGATIVE NEGATIVE Final   C Diff toxin NEGATIVE  NEGATIVE Final   C Diff interpretation Negative for toxigenic C. difficile  Final     Studies:   Recent x-ray studies have been reviewed in detail by the Attending Physician  Scheduled Meds:  Scheduled Meds: . antiseptic oral rinse  7 mL Mouth Rinse QID  . budesonide  0.5 mg Nebulization BID  . chlorhexidine gluconate  15 mL Mouth Rinse BID  . Chlorhexidine Gluconate Cloth  6 each Topical Q0600  . famotidine (PEPCID) IV  20 mg Intravenous Q24H  . hydrocortisone sod succinate (SOLU-CORTEF) inj  50 mg Intravenous Q8H   Followed by  . [START ON 05/15/2015] hydrocortisone sod succinate (SOLU-CORTEF) inj  50 mg Intravenous Q12H   Followed by  . [START ON 05/17/2015] hydrocortisone sod succinate (SOLU-CORTEF) inj  50 mg Intravenous Daily  . insulin aspart  0-20 Units Subcutaneous 6 times per day  . ipratropium-albuterol  3 mL Nebulization Q6H  . levofloxacin (LEVAQUIN) IV  750 mg Intravenous Q48H  . metronidazole  500 mg Intravenous Q8H  . mupirocin ointment  1 application Nasal BID    Time spent on care of this patient: 35 mins   MCCLUNG,JEFFREY T , MD   [facility] Office  [phone] Pager - Text Page per Loretha Stapler as per below:  On-Call/Text Page:      [email or web address]      password [email or web address]  If 7PM-7AM, please contact night-coverage [email or web address] Password [email or web address] 05/14/2015, 8:59 AM   LOS: 3 days

## 2015-05-14 NOTE — Progress Notes (Signed)
Trach collar attempted, patient desat to 90%, shallow respirations in the 40's, placed back on vent at this time. Sputum obtained via trach and sent to the lab.

## 2015-05-14 NOTE — Progress Notes (Signed)
Pharmacy Antibiotic Follow-up Note  Daniel Maynard is a 60 y.o. year-old male admitted on 05/30/2015.  The patient is currently on day 3 of antibiotics for presumed sepsis.  He received one loading dose of Vancomycin on 1/4 and had a random level obtained on 1/6 which was 29 and within goal of 20-40.  He has some renal insufficiency with creatinine still around 3.0 (1/6).  His estimated crcl is ~ 8835ml/min, he has reduced UOP (0.582ml/kg/hr)  He is on Levofloxacin and we have now been asked to restart his IV Vancomycin for persistent infiltrate.  Assessment/Plan: - Will begin Vancomycin 2 gm every 48 hours to provide extended interval for clearance.   - Monitor levels to determine the need to adjust dose - Trend WBC, temp, renal function, C&S    Temp (24hrs), Avg:100.2 F (37.9 C), Min:98.6 F (37 C), Max:102.4 F (39.1 C)   Recent Labs Lab 08-24-15 2138 05/12/15 0530 05/13/15 0515 05/14/15 0509  WBC 7.9 6.8 2.4* 1.5*     Recent Labs Lab 08-24-15 2138 05/12/15 0530 05/12/15 1000 05/12/15 1556 05/13/15 0515  CREATININE 4.58* 4.18* 3.65* 3.68* 2.96*   Estimated Creatinine Clearance: 39 mL/min (by C-G formula based on Cr of 2.96).    Allergies  Allergen Reactions  . Hydrocodone     Unknown  . Morphine And Related     unknown  . Zosyn [Piperacillin Sod-Tazobactam So]     Unknown    Antimicrobials this admission: Aztreonam 1/4>>1/5 Levaquin 1/4>>1/5>> Vancomycin 1/4>>1/6, 1/7> Imipenem 1/5>>1/6  Microbiology results: 1/4 BCx - NGTD 1/4 UCx - NG 1/5 MRSA PCR - Positive 1/5 Wound Cx -   Daniel Maynard, PharmD., MS Clinical Pharmacist Pager:  [phone] Thank you for allowing pharmacy to be part of this patients care team. 05/14/2015 10:33 AM

## 2015-05-15 ENCOUNTER — Inpatient Hospital Stay (HOSPITAL_COMMUNITY): Payer: Medicare Other

## 2015-05-15 DIAGNOSIS — A419 Sepsis, unspecified organism: Secondary | ICD-10-CM

## 2015-05-15 LAB — COMPREHENSIVE METABOLIC PANEL
ALK PHOS: 100 U/L (ref 38–126)
ALT: 33 U/L (ref 17–63)
ALT: 31 U/L (ref 17–63)
ANION GAP: 16 — AB (ref 5–15)
ANION GAP: 17 — AB (ref 5–15)
AST: 79 U/L — ABNORMAL HIGH (ref 15–41)
AST: 86 U/L — AB (ref 15–41)
Albumin: <1 g/dL — ABNORMAL LOW (ref 3.5–5.0)
Albumin: <1 g/dL — ABNORMAL LOW (ref 3.5–5.0)
Alkaline Phosphatase: 102 U/L (ref 38–126)
BILIRUBIN TOTAL: 0.9 mg/dL (ref 0.3–1.2)
BUN: 100 mg/dL — ABNORMAL HIGH (ref 6–20)
BUN: 102 mg/dL — ABNORMAL HIGH (ref 6–20)
CALCIUM: 4.6 mg/dL — AB (ref 8.9–10.3)
CALCIUM: 4.9 mg/dL — AB (ref 8.9–10.3)
CO2: 15 mmol/L — ABNORMAL LOW (ref 22–32)
CO2: 16 mmol/L — AB (ref 22–32)
Chloride: 110 mmol/L (ref 101–111)
Chloride: 103 mmol/L (ref 101–111)
Creatinine, Ser: 3.76 mg/dL — ABNORMAL HIGH (ref 0.61–1.24)
Creatinine, Ser: 3.89 mg/dL — ABNORMAL HIGH (ref 0.61–1.24)
GFR calc Af Amer: 18 mL/min — ABNORMAL LOW (ref >60)
GFR calc non Af Amer: 16 mL/min — ABNORMAL LOW (ref >60)
GFR, EST AFRICAN AMERICAN: 19 mL/min — AB (ref >60)
GFR, EST NON AFRICAN AMERICAN: 16 mL/min — AB (ref >60)
GLUCOSE: 315 mg/dL — AB (ref 65–99)
Glucose, Bld: 135 mg/dL — ABNORMAL HIGH (ref 65–99)
Potassium: 4.9 mmol/L (ref 3.5–5.1)
Potassium: 5.1 mmol/L (ref 3.5–5.1)
SODIUM: 141 mmol/L (ref 135–145)
Sodium: 136 mmol/L (ref 135–145)
TOTAL PROTEIN: 4.3 g/dL — AB (ref 6.5–8.1)
Total Bilirubin: 1.4 mg/dL — ABNORMAL HIGH (ref 0.3–1.2)
Total Protein: 4.6 g/dL — ABNORMAL LOW (ref 6.5–8.1)

## 2015-05-15 LAB — POCT I-STAT 3, ART BLOOD GAS (G3+)
Acid-base deficit: 12 mmol/L — ABNORMAL HIGH (ref 0.0–2.0)
Acid-base deficit: 13 mmol/L — ABNORMAL HIGH (ref 0.0–2.0)
Bicarbonate: 15.3 mEq/L — ABNORMAL LOW (ref 20.0–24.0)
Bicarbonate: 15.2 mEq/L — ABNORMAL LOW (ref 20.0–24.0)
O2 Saturation: 92 %
O2 Saturation: 91 %
PCO2 ART: 40.9 mmHg (ref 35.0–45.0)
PH ART: 7.167 — AB (ref 7.350–7.450)
PH ART: 7.178 — AB (ref 7.350–7.450)
PO2 ART: 77 mmHg — AB (ref 80.0–100.0)
Patient temperature: 98.6
TCO2: 16 mmol/L (ref 0–100)
TCO2: 16 mmol/L (ref 0–100)
pCO2 arterial: 42.8 mmHg (ref 35.0–45.0)
pO2, Arterial: 85 mmHg (ref 80.0–100.0)

## 2015-05-15 LAB — WOUND CULTURE

## 2015-05-15 LAB — LACTIC ACID, PLASMA: Lactic Acid, Venous: 5.2 mmol/L (ref 0.5–2.0)

## 2015-05-15 LAB — DIC (DISSEMINATED INTRAVASCULAR COAGULATION) PANEL
APTT: 35 s (ref 24–37)
D DIMER QUANT: 3.39 ug{FEU}/mL — AB (ref 0.00–0.50)
INR: 2.35 — AB (ref 0.00–1.49)
PLATELETS: 53 10*3/uL — AB (ref 150–400)

## 2015-05-15 LAB — TROPONIN I: Troponin I: 0.11 ng/mL — ABNORMAL HIGH (ref <0.031)

## 2015-05-15 LAB — CBC
HEMATOCRIT: 31.2 % — AB (ref 39.0–52.0)
HEMOGLOBIN: 10.2 g/dL — AB (ref 13.0–17.0)
MCH: 27.2 pg (ref 26.0–34.0)
MCHC: 32.7 g/dL (ref 30.0–36.0)
MCV: 83.2 fL (ref 78.0–100.0)
Platelets: 52 10*3/uL — ABNORMAL LOW (ref 150–400)
RBC: 3.75 MIL/uL — ABNORMAL LOW (ref 4.22–5.81)
RDW: 25.3 % — ABNORMAL HIGH (ref 11.5–15.5)
WBC: 1.7 10*3/uL — ABNORMAL LOW (ref 4.0–10.5)

## 2015-05-15 LAB — LACTATE DEHYDROGENASE: LDH: 960 U/L — ABNORMAL HIGH (ref 98–192)

## 2015-05-15 LAB — URINE CULTURE
CULTURE: NO GROWTH
SPECIAL REQUESTS: NORMAL

## 2015-05-15 LAB — GLUCOSE, CAPILLARY
GLUCOSE-CAPILLARY: 184 mg/dL — AB (ref 65–99)
GLUCOSE-CAPILLARY: 314 mg/dL — AB (ref 65–99)
Glucose-Capillary: 126 mg/dL — ABNORMAL HIGH (ref 65–99)
Glucose-Capillary: 153 mg/dL — ABNORMAL HIGH (ref 65–99)
Glucose-Capillary: 201 mg/dL — ABNORMAL HIGH (ref 65–99)
Glucose-Capillary: 262 mg/dL — ABNORMAL HIGH (ref 65–99)

## 2015-05-15 LAB — PROTIME-INR
INR: 4.68 — ABNORMAL HIGH (ref 0.00–1.49)
Prothrombin Time: 42.8 seconds — ABNORMAL HIGH (ref 11.6–15.2)

## 2015-05-15 LAB — DIC (DISSEMINATED INTRAVASCULAR COAGULATION)PANEL
Fibrinogen: 509 mg/dL — ABNORMAL HIGH (ref 204–475)
Prothrombin Time: 25.5 seconds — ABNORMAL HIGH (ref 11.6–15.2)

## 2015-05-15 LAB — MAGNESIUM: Magnesium: 2 mg/dL (ref 1.7–2.4)

## 2015-05-15 LAB — LIPASE, BLOOD: Lipase: 26 U/L (ref 11–51)

## 2015-05-15 MED ORDER — SODIUM CHLORIDE 0.9 % IV BOLUS (SEPSIS)
1000.0000 mL | Freq: Once | INTRAVENOUS | Status: AC
  Administered 2015-05-15: 1000 mL via INTRAVENOUS

## 2015-05-15 MED ORDER — SODIUM CHLORIDE 0.9 % IV SOLN
1.0000 g | Freq: Two times a day (BID) | INTRAVENOUS | Status: DC
  Administered 2015-05-15 – 2015-05-16 (×3): 1 g via INTRAVENOUS
  Filled 2015-05-15 (×5): qty 1

## 2015-05-15 MED ORDER — PHENYLEPHRINE HCL 10 MG/ML IJ SOLN
0.0000 ug/min | INTRAVENOUS | Status: DC
  Administered 2015-05-15: 70 ug/min via INTRAVENOUS
  Administered 2015-05-15: 20 ug/min via INTRAVENOUS
  Filled 2015-05-15 (×2): qty 1

## 2015-05-15 MED ORDER — LACTATED RINGERS IV BOLUS (SEPSIS)
500.0000 mL | Freq: Once | INTRAVENOUS | Status: AC
  Administered 2015-05-15: 500 mL via INTRAVENOUS

## 2015-05-15 MED ORDER — HYDROCORTISONE NA SUCCINATE PF 100 MG IJ SOLR
50.0000 mg | Freq: Four times a day (QID) | INTRAMUSCULAR | Status: DC
  Administered 2015-05-15 – 2015-05-16 (×5): 50 mg via INTRAVENOUS
  Filled 2015-05-15 (×2): qty 2
  Filled 2015-05-15: qty 1
  Filled 2015-05-15: qty 2
  Filled 2015-05-15 (×2): qty 1
  Filled 2015-05-15: qty 2
  Filled 2015-05-15: qty 1

## 2015-05-15 MED ORDER — DEXTROSE 5 % IV SOLN
1.0000 g | Freq: Three times a day (TID) | INTRAVENOUS | Status: DC
  Administered 2015-05-15: 1 g via INTRAVENOUS
  Filled 2015-05-15 (×3): qty 1

## 2015-05-15 MED ORDER — VITAMIN K1 10 MG/ML IJ SOLN
5.0000 mg | Freq: Once | INTRAVENOUS | Status: AC
  Administered 2015-05-15: 5 mg via INTRAVENOUS
  Filled 2015-05-15: qty 0.5

## 2015-05-15 MED ORDER — SODIUM CHLORIDE 0.9 % IV BOLUS (SEPSIS)
500.0000 mL | Freq: Once | INTRAVENOUS | Status: AC
  Administered 2015-05-15: 500 mL via INTRAVENOUS

## 2015-05-15 MED ORDER — ACETAMINOPHEN 650 MG RE SUPP
650.0000 mg | RECTAL | Status: AC | PRN
  Administered 2015-05-15 – 2015-05-16 (×2): 650 mg via RECTAL
  Filled 2015-05-15 (×2): qty 1

## 2015-05-15 MED ORDER — NOREPINEPHRINE BITARTRATE 1 MG/ML IV SOLN
2.0000 ug/min | INTRAVENOUS | Status: DC
  Administered 2015-05-15: 32 ug/min via INTRAVENOUS
  Filled 2015-05-15: qty 4

## 2015-05-15 MED ORDER — SODIUM CHLORIDE 0.9 % IV SOLN
2.0000 g | INTRAVENOUS | Status: AC
  Administered 2015-05-15 (×2): 2 g via INTRAVENOUS
  Filled 2015-05-15 (×2): qty 20

## 2015-05-15 MED ORDER — PHENYLEPHRINE HCL 10 MG/ML IJ SOLN
0.0000 ug/min | INTRAMUSCULAR | Status: DC
  Administered 2015-05-15: 400 ug/min via INTRAVENOUS
  Administered 2015-05-15: 140 ug/min via INTRAVENOUS
  Administered 2015-05-15 – 2015-05-16 (×10): 400 ug/min via INTRAVENOUS
  Filled 2015-05-15 (×13): qty 4

## 2015-05-15 MED ORDER — VASOPRESSIN 20 UNIT/ML IV SOLN
0.0300 [IU]/min | INTRAVENOUS | Status: DC
  Administered 2015-05-15 – 2015-05-16 (×2): 0.03 [IU]/min via INTRAVENOUS
  Filled 2015-05-15 (×2): qty 2

## 2015-05-15 MED ORDER — NOREPINEPHRINE BITARTRATE 1 MG/ML IV SOLN
2.0000 ug/min | INTRAVENOUS | Status: DC
  Administered 2015-05-15 – 2015-05-16 (×5): 50 ug/min via INTRAVENOUS
  Filled 2015-05-15 (×8): qty 16

## 2015-05-15 MED ORDER — SODIUM CHLORIDE 0.9 % IV SOLN: INTRAVENOUS | Status: DC | PRN

## 2015-05-15 MED ORDER — SODIUM BICARBONATE 8.4 % IV SOLN
INTRAVENOUS | Status: DC
  Administered 2015-05-15 – 2015-05-16 (×4): via INTRAVENOUS
  Filled 2015-05-15 (×11): qty 150

## 2015-05-15 NOTE — Progress Notes (Signed)
Pt became increasingly hypotensive throughout the night. Pt was given a fluid bolus at 0300. pts bp did not improve after the bolus. PCCM was contacted and pt was transferred to 2M04.

## 2015-05-15 NOTE — Progress Notes (Signed)
PULMONARY / CRITICAL CARE MEDICINE   Name: Daniel Maynard MRN: [id number] DOB: 09/28/1955    ADMISSION DATE:  05/20/2015  REFERRING MD : Dr. Clarene DukeLittle   SUBJECTIVE: Transferred back to unit from SDU for worsening hypotension despite fluids, encephalopathic, oliguric o/n, now requiring multiple pressor support  VITAL SIGNS: Temp:  [97.8 F (36.6 C)-102.4 F (39.1 C)] 100.8 F (38.2 C) (01/08 0800) Pulse Rate:  [64-95] 92 (01/08 0800) Resp:  [16-30] 24 (01/08 0800) BP: (47-123)/(30-106) 105/68 mmHg (01/08 0800) SpO2:  [89 %-100 %] 95 % (01/08 0800) Arterial Line BP: (78-101)/(38-48) 97/48 mmHg (01/08 0800) FiO2 (%):  [40 %] 40 % (01/08 0319) Weight:  [161.481 kg (356 lb)] 161.481 kg (356 lb) (01/08 0800) HEMODYNAMICS: CVP:  [8 mmHg-13 mmHg] 9 mmHg VENTILATOR SETTINGS: Vent Mode:  [-] PRVC FiO2 (%):  [40 %] 40 % Set Rate:  [22 bmp] 22 bmp Vt Set:  [500 mL] 500 mL PEEP:  [5 cmH20] 5 cmH20 Plateau Pressure:  [20 cmH20-22 cmH20] 21 cmH20 INTAKE / OUTPUT: Intake/Output      01/07 0701 - 01/08 0700 01/08 0701 - 01/09 0700   I.V. (mL/kg) 75.3 (0.5)    IV Piggyback 100    Total Intake(mL/kg) 175.3 (1.1)    Urine (mL/kg/hr) 150 (0)    Stool 0 (0)    Total Output 150     Net +25.3          Stool Occurrence 1 x       PHYSICAL EXAMINATION: General:  Obtunded, on vent HENT: Trach in place, pupils reactive b/l symmetric PULM: crackles bilaterally, vent supported breaths CV: RRR, distant heart sounds GI: BS diminished, soft, nontender MSK: no peripheral edea Neuro: encephalopathic, withdraw to pain otherwise nonresponsive  LABS: Reviewed, Cr stable, leukopenic  CXR images reviewed> persistent infiltrates, trach in place  Antibiotics Vancomycin 1/4>>>x1 day, restart 1/7 Aztreonam 1/4>>> x 1 day Imipenem 1/5 x1 day Levaquin 1/4, 1/6>>>  Flagyl 1/6>>  Cultures Blood 1/4>>> Urine 1/4>>> Wound 1/5>>> GNRs, few GPC no staph resp 1/7 >   ASSESSMENT /  PLAN:  PULMONARY A: HCAP >seems most likely, agree with MRSA coverage Pulmonary edema? Possible, although was improving on gentle fluids until 1/7 OHS/atelectasis > nocturnal vent Acute on chronic respiratory failure with hypoxemia+hypercapnea> definitely worse than last admission P:   Worsening mental status, acidemia Continue full vent support at night Bicarb infusion only fluids Continue broad antibiotic coverage including MRSA Cont pulmicort, duonebs Repeat ABG after 8 hrs Keep XLT trach in place  CARDIOVASCULAR A: sCHF Hx, but TTE showing normal LVEF 65-70% Pacemaker, eliquis baseline Hx HTN Hypotensive persistent with Pos fluid balance, last CVP 7-13 P:  Levophed to MAP 60 Repeat saline bolus Added vasopressin, neo for refractory hypotension Hold home medications Norvasc and Coreg in the setting of hypotension Hold home medication Torsemide in the setting of hypotension and kidney injury worsened  RENAL A:  ATN in the setting of severe sepsis (hypotension), improving now worse again Oliguric AKI on CKD stage III Serum Cr 4.58 on admission now 3.76; baseline approximately 1.7. Hypocalcemia-corrected is 7.0 P:  Bicarb gtt at 1175mL/hr Saline bolus as above Repeat Bmet 8hrs Replace Ca  GASTROINTESTINAL A:  GI prophylaxis Diarrhea stopped, GI panel neg Decreased bowel sounds now Cannot place NG 2/2 nasopharyx anatomy P:  D/c enteric precautions now contact only Famotidine for SUP  HEMATOLOGIC A: Leukopenia, thrombocytopenia; r/t sepsis - DIC? DVT prophylaxis P:  Heparin continuous infusion for Afib, hold with plat drop Repeat  AM CBC  INFECTIOUS A: HCAP vs CHF infiltrates, small effusions, atelectasis noted on bases w/ CT Blood, urine Cx pending Diarrhea decreased P:  Vancomycin, aztreonam, levofloxacin Tylenol suppository as needed for fevers F/U urine culture F/U blood cultures Send GI panel  ENDOCRINE A: DM 2 P:  Monitor  CBGs sliding scale insulin  NEUROLOGIC A: Encephalopathic on vent Responsive to painful stimuli, pupils reactive P:  No sedation at this time 2/2 encephalopathic  Family: Family updated by phone 1/8, planning to discuss in person if travel permitting   Fuller Plan, MD PGY-I Internal Medicine Resident Pager# [phone] 05/15/2015, 8:36 AM

## 2015-05-15 NOTE — Progress Notes (Signed)
eLink Physician-Brief Progress Note Patient Name: Daniel SouthwardDanny Kryder DOB: 04/06/1956 MRN: [id number]   Date of Service  05/15/2015  HPI/Events of Note  60 yo male brought to ER on 1/04 with fever, low urine outpt, and altered mental status.  Hx of chronic respiratory failure s/p trach.  Being tx for HCAP, hidradenitis.  Transferred to SDU 1/07 >> noted to be non verbal, but alert in AM of 1/07.  On 1/08 noted to be hypotensive >> no improvement after 1 liter fluid.  Pt comatose, but RN reports pupils reactive.   eICU Interventions  Will have arterial line placed , start levophed, and get bedside MD to further assess.  Will transfer back to ICU.      Intervention Category Major Interventions: Other:  Laurette Villescas 05/15/2015, 4:46 AM

## 2015-05-15 NOTE — Progress Notes (Signed)
Pharmacy Antibiotic Follow-up Note  Daniel Maynard is a 60 y.o. year-old male admitted on 06/07/2015.  The patient is currently on day 1 of antibiotics for HCAP/VDRF. Patient is a chronic trach patient who was transferred back to ICU with hypotension requiring pressors and encephalopathy. Antibiotics were re-broadened and patient was re-cultured. Patient's renal function is worsening.   Assessment/Plan: Add meropenem 1 g q12h Continue levofloxacin and vancomycin Monitor renal function, culture data, clinical status Will order VT before next dose to evaluate for toxicity - will not be able to evaluate for efficacy of dose due to not being at steady state   Temp (24hrs), Avg:100.4 F (38 C), Min:97.8 F (36.6 C), Max:102.4 F (39.1 C)   Recent Labs Lab 06/03/2015 2138 05/12/15 0530 05/13/15 0515 05/14/15 0509 05/15/15 0611  WBC 7.9 6.8 2.4* 1.5* 1.7*    Recent Labs Lab 05/12/15 1000 05/12/15 1556 05/13/15 0515 05/14/15 0509 05/15/15 0611  CREATININE 3.65* 3.68* 2.96* 2.99* 3.76*   Estimated Creatinine Clearance: 31.2 mL/min (by C-G formula based on Cr of 3.76).    Allergies  Allergen Reactions  . Hydrocodone     Unknown  . Morphine And Related     unknown  . Zosyn [Piperacillin Sod-Tazobactam So]     Unknown    Antimicrobials this admission: Aztreonam 1/4>>1/5, 1/8 x 1 Levaquin 1/4>>1/5, 1/6>> Vancomycin 1/4>>1/6, 1/7>> Imipenem 1/5>>1/6 Flagyl 1/6>>1/7 Meropenem 1/8>>  Levels/dose changes this admission: 1/6 Vanc random - 29  Microbiology results: 1/4 BCx - ngtd 1/4 UCx - ngtd 1/4 UA - Many bacteria, sm leukocytes, pos nitrite, wbc 0-5 1/5 MRSA PCR - positive 1/5 Wound Cx - neg 1/5 CDiff - negative 1/6 GI Panel Neg 1/7 Blood x 2 1/7 Urine 1/8 Blood  Thank you for allowing pharmacy to be a part of this patient's care.  Isaac BlissMichael Desmen Schoffstall, PharmD, BCPS, [facility] Clinical Pharmacist Pager [phone] 05/15/2015 10:28 AM

## 2015-05-15 NOTE — Progress Notes (Signed)
eLink Physician-Brief Progress Note Patient Name: Daniel SouthwardDanny Maynard DOB: 06/13/1955 MRN: [id number]   Date of Service  05/15/2015  HPI/Events of Note  Blood pressure low.  eICU Interventions  Will give fluid bolus  1 liter NS.     Intervention Category Major Interventions: Other:  Serah Nicoletti 05/15/2015, 2:26 AM

## 2015-05-15 NOTE — Progress Notes (Signed)
Pharmacy Antibiotic Follow-up Note  Daniel SouthwardDanny Maynard is a 60 y.o. year-old male admitted on 05/08/2015, VDRF, now with hypotension and possible sepsis, adding Aztreonam  Assessment/Plan: Aztreonam 1 g IV q8h  Temp (24hrs), Avg:100.7 F (38.2 C), Min:97.8 F (36.6 C), Max:102.4 F (39.1 C)   Recent Labs Lab 05/30/15 2138 05/12/15 0530 05/13/15 0515 05/14/15 0509 05/15/15 0611  WBC 7.9 6.8 2.4* 1.5* 1.7*     Recent Labs Lab 05/12/15 1000 05/12/15 1556 05/13/15 0515 05/14/15 0509 05/15/15 0611  CREATININE 3.65* 3.68* 2.96* 2.99* 3.76*   Estimated Creatinine Clearance: 30.7 mL/min (by C-G formula based on Cr of 3.76).    Allergies  Allergen Reactions  . Hydrocodone     Unknown  . Morphine And Related     unknown  . Zosyn [Piperacillin Sod-Tazobactam So]     Unknown    Antimicrobials this admission: Aztreonam 1/4>>1/5, 1/8 >> Levaquin 1/4>>1/5>> Vancomycin 1/4>>1/6, 1/7> Imipenem 1/5>>1/6   Daniel RisenGreg Madalaine Maynard, PharmD, BCPS  05/15/2015 6:39 AM

## 2015-05-15 NOTE — Progress Notes (Signed)
SLP Cancellation Note  Patient Details Name: Mory Bleau MRN: [id number] DOB: 01/03/1956   Ulyses SouthwardCancelled treatment:       Reason Eval/Treat Not Completed: Medical issues which prohibited therapy. On vent. Will f/u 1/9.   Ferdinand LangoLeah Aiden Rao MA, CCC-SLP [phone]    Ferdinand LangoMcCoy Niccolas Loeper [name] 05/15/2015, 9:55 AM

## 2015-05-15 NOTE — Procedures (Signed)
Arterial Catheter Insertion Procedure Note Daniel SouthwardDanny Maynard [id number] 08/08/1955  Procedure: Insertion of Arterial Catheter  Indications: Blood pressure monitoring  Procedure Details Consent: Unable to obtain consent because of emergent medical necessity. Time Out: Verified patient identification, verified procedure, site/side was marked, verified correct patient position, special equipment/implants available, medications/allergies/relevent history reviewed, required imaging and test results available.  Performed  Maximum sterile technique was used including antiseptics, cap, gloves, gown, hand hygiene, mask and sheet. Skin prep: Chlorhexidine;  20 gauge catheter was inserted into right radial artery using the Seldinger technique under ultrasound guidance.  Evaluation Blood flow good; BP tracing good. Complications: No apparent complications.   Daniel GovernSarah Ellen Lucca Greggs, MD 05/15/2015

## 2015-05-15 NOTE — Progress Notes (Signed)
Mr. Daniel Maynard's niece Avon Gully(HCPOA), son, and many members of his extended family presented to the bedside today. I discussed with them that he is currently in apparent septic shock from a severe HCAP now with evidence of multiorgan system failure. He is currently on full mechanical ventilation support and nearly max dose on 3 pressor agents. He is currently unconscious with a RASS of -4 without sedation since my first evaluation this morning. His prognosis is guarded due to the severity of illness.  His wishes for care were discussed and his niece in person and sister by telephone expressed that he had told them explicitly before this recent illness that he did not want to undergo extreme measures including specifically placing additional tubes, being shocked with pads, or undergoing chest compressions.  There was general consensus among family members present that they wished to continue his current level of supportive care. However in the event of a cardiac arrest they do not want electrical defibrillation or CPR to be administered.

## 2015-05-15 NOTE — Progress Notes (Signed)
eLink Physician-Brief Progress Note Patient Name: Daniel SouthwardDanny Maynard DOB: 12/23/1955 MRN: [id number]   Date of Service  05/15/2015  HPI/Events of Note  Metabolic acidosis, lactic acid up, renal fx worse,  Hypocalcemia, INR up, WBC low, PLT low.   eICU Interventions  Repeat cx's, add azactam, add HCO3 to IV fluid, give vit K x one, check ionized Ca, f/u labs ordered.      Intervention Category Major Interventions: Other:  Saba Gomm 05/15/2015, 6:37 AM

## 2015-05-15 NOTE — Progress Notes (Addendum)
Call received per floor RN at 0451 regarding Pt with critical low BP. Orders received to transfer Pt to ICU for levophed gtt. RN advised to notify Pharmacy and bed placement while RRT en route. Upon my arrival Levophed gtt started emergently and titrated to 30 mcg. ABG and CXR done on 2C. Pt transferred to 2M04 at 0530 . Care [facility], telephone report received.

## 2015-05-15 NOTE — Progress Notes (Signed)
[facility] PCCM  I met with the patient's family (son Linton Rump, brother in Medical sales representative). They confirm that we should not escalate his level of care as he has previously stated that she did not want to be intubated.  Will change code status to limited code blue  Roselie Awkward, MD [facility] P[facility]M Pager: [phone] Cell: [phone] After 3pm or if no response, call [phone]

## 2015-05-16 ENCOUNTER — Inpatient Hospital Stay (HOSPITAL_COMMUNITY): Payer: Medicare Other

## 2015-05-16 LAB — BLOOD GAS, ARTERIAL
ACID-BASE DEFICIT: 13.6 mmol/L — AB (ref 0.0–2.0)
ACID-BASE DEFICIT: 12.1 mmol/L — AB (ref 0.0–2.0)
Bicarbonate: 12.3 mEq/L — ABNORMAL LOW (ref 20.0–24.0)
Bicarbonate: 14.7 mEq/L — ABNORMAL LOW (ref 20.0–24.0)
DRAWN BY: 441381
DRAWN BY: 40415
FIO2: 0.4
FIO2: 0.6
LHR: 22 {breaths}/min
MECHVT: 500 mL
O2 SAT: 97 %
O2 SAT: 88.2 %
PATIENT TEMPERATURE: 102.2
PEEP/CPAP: 5 cmH2O
PEEP/CPAP: 5 cmH2O
PH ART: 7.142 — AB (ref 7.350–7.450)
PO2 ART: 129 mmHg — AB (ref 80.0–100.0)
Patient temperature: 103.4
RATE: 22 resp/min
TCO2: 13.2 mmol/L (ref 0–100)
TCO2: 16 mmol/L (ref 0–100)
VT: 500 mL
pCO2 arterial: 32.6 mmHg — ABNORMAL LOW (ref 35.0–45.0)
pCO2 arterial: 47 mmHg — ABNORMAL HIGH (ref 35.0–45.0)
pH, Arterial: 7.215 — ABNORMAL LOW (ref 7.350–7.450)
pO2, Arterial: 81.4 mmHg (ref 80.0–100.0)

## 2015-05-16 LAB — COMPREHENSIVE METABOLIC PANEL
ALK PHOS: 82 U/L (ref 38–126)
ALT: 26 U/L (ref 17–63)
ANION GAP: 17 — AB (ref 5–15)
AST: 89 U/L — ABNORMAL HIGH (ref 15–41)
Albumin: <1 g/dL — ABNORMAL LOW (ref 3.5–5.0)
BILIRUBIN TOTAL: 1.5 mg/dL — AB (ref 0.3–1.2)
BUN: 101 mg/dL — ABNORMAL HIGH (ref 6–20)
CALCIUM: 4.7 mg/dL — AB (ref 8.9–10.3)
CO2: 16 mmol/L — ABNORMAL LOW (ref 22–32)
Chloride: 101 mmol/L (ref 101–111)
Creatinine, Ser: 4.45 mg/dL — ABNORMAL HIGH (ref 0.61–1.24)
GFR calc non Af Amer: 13 mL/min — ABNORMAL LOW (ref >60)
GFR, EST AFRICAN AMERICAN: 15 mL/min — AB (ref >60)
Glucose, Bld: 333 mg/dL — ABNORMAL HIGH (ref 65–99)
Potassium: 4.5 mmol/L (ref 3.5–5.1)
SODIUM: 134 mmol/L — AB (ref 135–145)
TOTAL PROTEIN: 3.9 g/dL — AB (ref 6.5–8.1)

## 2015-05-16 LAB — CBC
HEMATOCRIT: 36.6 % — AB (ref 39.0–52.0)
HEMOGLOBIN: 11.2 g/dL — AB (ref 13.0–17.0)
MCH: 25.9 pg — AB (ref 26.0–34.0)
MCHC: 30.6 g/dL (ref 30.0–36.0)
MCV: 84.7 fL (ref 78.0–100.0)
Platelets: 32 10*3/uL — ABNORMAL LOW (ref 150–400)
RBC: 4.32 MIL/uL (ref 4.22–5.81)
RDW: 25.5 % — ABNORMAL HIGH (ref 11.5–15.5)
WBC: 1.5 10*3/uL — ABNORMAL LOW (ref 4.0–10.5)

## 2015-05-16 LAB — CALCIUM, IONIZED: Calcium, Ionized, Serum: <0.3 mg/dL — ABNORMAL LOW (ref 4.5–5.6)

## 2015-05-16 LAB — CULTURE, BLOOD (ROUTINE X 2)
CULTURE: NO GROWTH
Culture: NO GROWTH

## 2015-05-16 LAB — GLUCOSE, CAPILLARY
GLUCOSE-CAPILLARY: 286 mg/dL — AB (ref 65–99)
GLUCOSE-CAPILLARY: 273 mg/dL — AB (ref 65–99)
GLUCOSE-CAPILLARY: 260 mg/dL — AB (ref 65–99)

## 2015-05-16 LAB — PROTIME-INR
INR: 2.18 — ABNORMAL HIGH (ref 0.00–1.49)
Prothrombin Time: 24.1 seconds — ABNORMAL HIGH (ref 11.6–15.2)

## 2015-05-16 LAB — VANCOMYCIN, TROUGH: Vancomycin Tr: 15 ug/mL (ref 10.0–20.0)

## 2015-05-16 MED ORDER — SODIUM CHLORIDE 0.9 % IV SOLN
500.0000 mg | Freq: Two times a day (BID) | INTRAVENOUS | Status: DC
  Filled 2015-05-16: qty 0.5

## 2015-05-16 MED ORDER — FENTANYL BOLUS VIA INFUSION
50.0000 ug | INTRAVENOUS | Status: DC | PRN
  Filled 2015-05-16: qty 200

## 2015-05-16 MED ORDER — INSULIN ASPART 100 UNIT/ML ~~LOC~~ SOLN
0.0000 [IU] | SUBCUTANEOUS | Status: DC
  Administered 2015-05-16 (×2): 16 [IU] via SUBCUTANEOUS

## 2015-05-16 MED ORDER — SODIUM CHLORIDE 0.9 % IV SOLN
100.0000 ug/h | INTRAVENOUS | Status: DC
  Administered 2015-05-16: 100 ug/h via INTRAVENOUS
  Filled 2015-05-16: qty 50

## 2015-05-16 MED ORDER — ACETAMINOPHEN 650 MG RE SUPP
650.0000 mg | RECTAL | Status: DC | PRN
  Administered 2015-05-16: 650 mg via RECTAL
  Filled 2015-05-16: qty 1

## 2015-05-16 MED ORDER — LEVOFLOXACIN IN D5W 500 MG/100ML IV SOLN: 500.0000 mg | INTRAVENOUS | Status: DC

## 2015-05-17 LAB — URINE CULTURE: Culture: NO GROWTH

## 2015-05-18 LAB — CULTURE, RESPIRATORY

## 2015-05-18 LAB — CULTURE, RESPIRATORY W GRAM STAIN

## 2015-05-18 LAB — CULTURE, BLOOD (ROUTINE X 2)

## 2015-05-19 LAB — CULTURE, BLOOD (ROUTINE X 2)
CULTURE: NO GROWTH
Culture: NO GROWTH

## 2015-05-20 LAB — CULTURE, BLOOD (ROUTINE X 2): CULTURE: NO GROWTH

## 2015-05-24 ENCOUNTER — Telehealth: Payer: Self-pay

## 2015-05-24 NOTE — Telephone Encounter (Signed)
On 05/23/2015 I received a death certificate from [facility] (Faxed). The death certificate is for cremation. The patient is a patient of Doctor McQuaid. The death certificate will be taken to [facility] [address] ( ) today for signature. On 06-02-15 I received the death certificate back from Doctor McQuaid. I got the death certificate ready and faxed a copy to the funeral home per their request. I also put the original in the mail to the funeral home per their request.

## 2015-05-26 ENCOUNTER — Telehealth: Payer: Self-pay

## 2015-05-26 NOTE — Telephone Encounter (Signed)
On 05/26/2015 I received a death certificate from [facility] (original). The death certificate is for cremation. The patient is a patient of Doctor McQuaid. The death certificate will be taken to [facility]) for signature this pm. On 06-02-2015 I received the death certificate back from Doctor McQuaid. I got the death certificate ready and faxed a copy to the funeral home per their request. I also put the original in the mail per their request.

## 2015-06-08 NOTE — Progress Notes (Signed)
Pt noted to be asystolic, absent pulses, and no respirations. Time of death 1447 pronounced by Dr. Dimple Caseyice. CDS notified.

## 2015-06-08 NOTE — Discharge Summary (Signed)
  Name: Daniel Maynard MRN: [id number] DOB: 03/21/1956 60 y.o.  Date of Admission: 06/05/2015  8:34 PM Date of Discharge: 05/25/2015 Attending Physician: Lupita Leashouglas B McQuaid, MD  Cause of death:  1. Septic shock 2. Healthcare associated pneumonia 3. Respiratory failure 4. Acute renal failure  Time of death: 1447  Disposition and follow-up:   [name] was discharged from [facility] in expired condition.    Hospital Course: 60 year old male with a past medical history of diabetes, CHF, CKD, chronic respiratory failure with tracheostomy (nighttime ventilator use and daytime trach collar) was [facility] for low urine output, fever, and lethargy since 05/12/15.  Patient was previously [facility] to [facility]  on 04/29/2015 for acute respiratory failure with hypoxia and hypercarbia and fluid overload. He was also found to have acute encephalopathy likely from hypercarbia and respiratory acidosis which according to the discharge summary resolved prior to discharge.  After admission he was placed on continuous ventilator support for respiratory failure, received fluid resuscitation, and was placed on pressors due to refractory hypotension. He was treated with broad spectrum antibiotics and cultures were followed up but unremarkable. He was assessed for abdominal pain, distension, profuse diarrhea with nonspecific abdominal CT. He showed initial improvement tolerating removal of pressors and improvement in renal function and was transferred to stepdown unit by 1/7. However he quickly deteriorated with worsening septic shock and returned to ICU overnight requiring titration up to levophed, vasopressin, neo, bicarbonate infusion. Severe acidosis, hypocalcemia were related to his sepsis and renal failure. He was maintained on this management for one day during which his condition deteriorated despite support. At family request he was  transitioned to comfort measures on 1/9. Mechanical ventilation was discontinued and he expired shortly afterwards at 2:47pm.  Signed: Fuller Planhristopher W Aerika Groll, MD 05/27/2015, 3:23 PM

## 2015-06-08 NOTE — Progress Notes (Signed)
Patient removed from ventilator per Withdrawal of life protocol. RN at bedside. Patient comfortable at this time.

## 2015-06-08 NOTE — Progress Notes (Addendum)
Family presented to bedside today to see Mr. Daniel SouthwardDanny Nijjar. His clinical course was discussed again and he has shown worsening organ system failure despite continued care since yesterday. At this time he has a GCS of 3 on no sedation Based on this the family is interested in making him as comfortable as possible and not continuing medical interventions that would only prolong his life without likely improvement in quality. I agree with this plan and we will transition to comfort measures.

## 2015-06-08 NOTE — Progress Notes (Signed)
Pharmacy Antibiotic Follow-up Note  Daniel Maynard is a 60 y.o. year-old male admitted on 05/28/2015.  The patient is currently on day 6 of vancomycin/levofloxacin and day 2 meropenem for sepsis.   Assessment/Plan: - Continue vancomycin 2g IV q48h - Decrease meropenem to 500 mg IV q12h  - Decrease levofloxacin to 500 mg IV q48h - Will monitor vanc random as needed to assess for dose changes - Monitor C&S, renal function and clinical progression  Temp (24hrs), Avg:103.2 F (39.6 C), Min:102.5 F (39.2 C), Max:103.8 F (39.9 C)   Recent Labs Lab 05/12/15 0530 05/13/15 0515 05/14/15 0509 05/15/15 0611 11/27/15 0526  WBC 6.8 2.4* 1.5* 1.7* 1.5*    Recent Labs Lab 05/13/15 0515 05/14/15 0509 05/15/15 0611 05/15/15 2036 11/27/15 0526  CREATININE 2.96* 2.99* 3.76* 3.89* 4.45*   Estimated Creatinine Clearance: 25.8 mL/min (by C-G formula based on Cr of 4.45).    Allergies  Allergen Reactions  . Hydrocodone     Unknown  . Morphine And Related     unknown  . Zosyn [Piperacillin Sod-Tazobactam So]     Unknown    Antimicrobials this admission: Aztreonam 1/4>>1/5, 1/8 x 1 Levaquin 1/4>>1/5, 1/6>> Vancomycin 1/4>>1/6, 1/7>> Imipenem 1/5>>1/6 Flagyl 1/6>>1/7 Meropenem 1/8>>  Levels/dose changes this admission: 1/6 Vanc random - 29 1/9 Vanc random - 15  Microbiology results: 1/4 BCx - ngtd 1/4 UCx - ngtd 1/4 UA - Many bacteria, sm leukocytes, pos nitrite, wbc 0-5 1/5 MRSA PCR - positive 1/5 Wound Cx - neg 1/5 CDiff - negative 1/6 GI Panel - Neg 1/7 Blood x 2 -ngtd 1/7 Urine - ngtd 1/7 Sputum Cx - few gram negative rods 1/8 Blood - 1/8 UrineCx -  Thank you for allowing pharmacy to be a part of this patient's care.  Casilda Carlsaylor Emmarie Sannes, PharmD. PGY-1 Pharmacy Resident Pager: [phone] 05/22/2015 1:15 PM

## 2015-06-08 NOTE — Progress Notes (Signed)
SLP Cancellation Note  Patient Details Name: Daniel SouthwardDanny Maynard MRN: [id number] DOB: 07/12/1955   Cancelled treatment:       Reason Eval/Treat Not Completed: Medical issues which prohibited therapy. Signing off. Please re consult if becomes appropriate.  Ferdinand LangoLeah Able Malloy MA, CCC-SLP [phone]    [name] [name] 05/26/2015, 7:10 AM

## 2015-06-08 NOTE — Progress Notes (Signed)
   05/15/2015 1400  Clinical Encounter Type  Visited With Patient and family together;Health care provider  Visit Type Psychological support;Spiritual support;Social support;Critical Care  Referral From Care management  Spiritual Encounters  Spiritual Needs Emotional;Grief support  Stress Factors  Family Stress Factors Exhausted;Health changes;Loss of control;Major life changes  Advance Directives (For Healthcare)  Does patient have an advance directive? No   Chaplain reviewed information with medical team and provided emotional and spiritual  Support via prayer and biblical literature. Chaplain also encouraged pt's family to focus on the present. Chaplain helped Family with exploring anger and anxiety. Chaplain also explored feelings of hope, fears, loneliness, and issue of faith. Lastly, chaplain provided religious ritual of "anointing of the sick".

## 2015-06-08 NOTE — Progress Notes (Signed)
Daniel Maynard, pt POA notified than pt has deteriorated through the night, now with BP 60/40's and uncertain how much longer he will be with us.  Pt acknowledged call but did not indicate she would be coming in to be with pt.

## 2015-06-08 NOTE — Progress Notes (Signed)
PULMONARY / CRITICAL CARE MEDICINE   Name: Daniel Maynard MRN: [id number] DOB: 05-08-1955    ADMISSION DATE:  05-25-2015  REFERRING MD : Dr. Clarene Duke   SUBJECTIVE: Family meeting at bedside yesterday, patient diarrhea recurred o/n, BP worsened and FiO2 requirement increased, family was notified by phone, now fully nonresponsive  VITAL SIGNS: Temp:  [100.8 F (38.2 C)-103.8 F (39.9 C)] 103.4 F (39.7 C) (01/09 0400) Pulse Rate:  [75-107] 79 (01/09 0715) Resp:  [17-27] 22 (01/09 0715) BP: (60-132)/(37-74) 74/38 mmHg (01/09 0715) SpO2:  [92 %-100 %] 98 % (01/09 0715) Arterial Line BP: (62-110)/(35-50) 66/37 mmHg (01/09 0715) FiO2 (%):  [40 %-60 %] 60 % (01/09 0600) Weight:  [156.037 kg (344 lb)-161.481 kg (356 lb)] 156.037 kg (344 lb) (01/09 0400) HEMODYNAMICS: CVP:  [6 mmHg-12 mmHg] 10 mmHg VENTILATOR SETTINGS: Vent Mode:  [-] PRVC FiO2 (%):  [40 %-60 %] 60 % Set Rate:  [22 bmp] 22 bmp Vt Set:  [500 mL] 500 mL PEEP:  [5 cmH20] 5 cmH20 Plateau Pressure:  [19 cmH20-21 cmH20] 20 cmH20 INTAKE / OUTPUT: Intake/Output      01/08 0701 - 01/09 0700 01/09 0701 - 01/10 0700   I.V. (mL/kg) 8456.9 (54.2)    IV Piggyback 2190    Total Intake(mL/kg) 10646.9 (68.2)    Urine (mL/kg/hr) 76 (0)    Stool 0 (0)    Total Output 76     Net +10570.9          Stool Occurrence 2 x       PHYSICAL EXAMINATION: General:  Obtunded, on vent HEENT: Trach in place, pupils reactive b/l symmetric PULM: rales bilaterally, vent supported breaths CV: RRR, distant heart sounds GI: BS diminished, soft, obese MSK: 1+ pitting edema, skin cool and dry Neuro: RASS -5 on no sedation  LABS: Reviewed, Cr stable, leukopenic  CXR images reviewed> persistent infiltrates, trach in place  Antibiotics Vancomycin 1/4>>>x1 day, restart 1/7 Aztreonam 1/4>>> x 1 day Imipenem 1/5 x1 day Levaquin 1/4, 1/6>>>  Flagyl 1/6>>1/7 Meropenem 1/8>>>  Cultures Blood 1/4>>> Urine 1/4>>> Wound 1/5>>> GNRs, few GPC no  staph resp 1/7 >   ASSESSMENT / PLAN:  Overall condition  PULMONARY A: HCAP seems most likely Pulmonary edema? Possible, although was improving on gentle fluids until 1/7 OHS/atelectasis > baseline nocturnal vent now full support Acute on chronic respiratory failure with hypoxemia+hypercapnea P:   Hypercapnea, acidemia on vent and bicarb Hypoxia, increase FiO2 to 60% Bicarb infusion only fluids Continue broad antibiotic coverage including MRSA Cont pulmicort, duonebs Keep XLT trach in place F/U ABG  CARDIOVASCULAR A: sCHF Hx, but TTE showing normal LVEF 65-70% Pacemaker, eliquis baseline Edematous, rales Sinus rhythm now P:  Levophed, vasopressin, neo maxed MAP 40s Repeat saline bolus Added vasopressin, neo for refractory hypotension Hold home medications Norvasc and Coreg in the setting of hypotension Hold home medication Torsemide in the setting of hypotension and kidney injury worsened  RENAL A:  ATN in the setting of severe sepsis (hypotension), improving now worse again Oliguric AKI on CKD stage III Serum Cr 4.58 on admission now 4.45; baseline approximately 1.7. Hypocalcemia-corrected is 7.1 P:  Bicarb gtt at 166mL/hr F/U Bmet Replace Ca  GASTROINTESTINAL A:  GI prophylaxis Decreased bowel sounds now P:  Profuse stooling o/n, Rectal tube Famotidine for SUP  HEMATOLOGIC A: Leukopenia, thrombocytopenia; r/t sepsis - DIC panel neg DVT prophylaxis P:  No longer in Afib F/U CBC  INFECTIOUS A: HCAP vs CHF infiltrates, small effusions, atelectasis noted on bases  w/ CT Blood, urine Cx pending Drainage from axilla, scrotum Diarrhea resumed P:  Meropenem, Vancomycin, levofloxacin Tylenol suppository as needed for fevers F/U urine culture F/U blood cultures  ENDOCRINE A: DM 2 P:  Monitor CBGs Dose adjust sliding scale insulin  NEUROLOGIC A: Encephalopathic on vent Responsive to painful stimuli, pupils reactive P:  No sedation  at this time 2/2 encephalopathic  Family: Family meeting yesterday 1/8, discussed partial code status and intention to not escalate care. Updated by phone early in AM.   Fuller Planhristopher W Breahna Boylen, MD PGY-I Internal Medicine Resident Pager# [phone] 05/09/2015, 7:39 AM

## 2015-06-08 NOTE — Care Management Note (Signed)
Case Management Note  Patient Details  Name: Ulyses SouthwardDanny Spittler MRN: [id number] Date of Birth: 01/05/1956  Subjective/Objective:                    Action/Plan:   Expected Discharge Date:   (going to [facility])               Expected Discharge Plan:  Hospice Medical Facility  In-House Referral:  Clinical Social Work  Discharge planning Services  CM Consult  Post Acute Care Choice:    Choice offered to:     DME Arranged:    DME Agency:     HH Arranged:    HH Agency:     Status of Service:  Completed, signed off  Medicare Important Message Given:    Date Medicare IM Given:    Medicare IM give by:    Date Additional Medicare IM Given:    Additional Medicare Important Message give by:     If discussed at Long Length of Stay Meetings, dates discussed:    Additional Comments:  Vangie BickerBrown, Emmily Pellegrin Jane, RN 05/25/2015, 3:14 PM

## 2015-06-08 DEATH — deceased

## 2016-11-14 IMAGING — CR DG CHEST 1V PORT
1 series · 1 of 1 positions shown · non-contrast
Comparison: May 02, 2015

CLINICAL DATA: Fever and lethargy for 1 day

EXAM:
PORTABLE CHEST 1 VIEW

[AP]
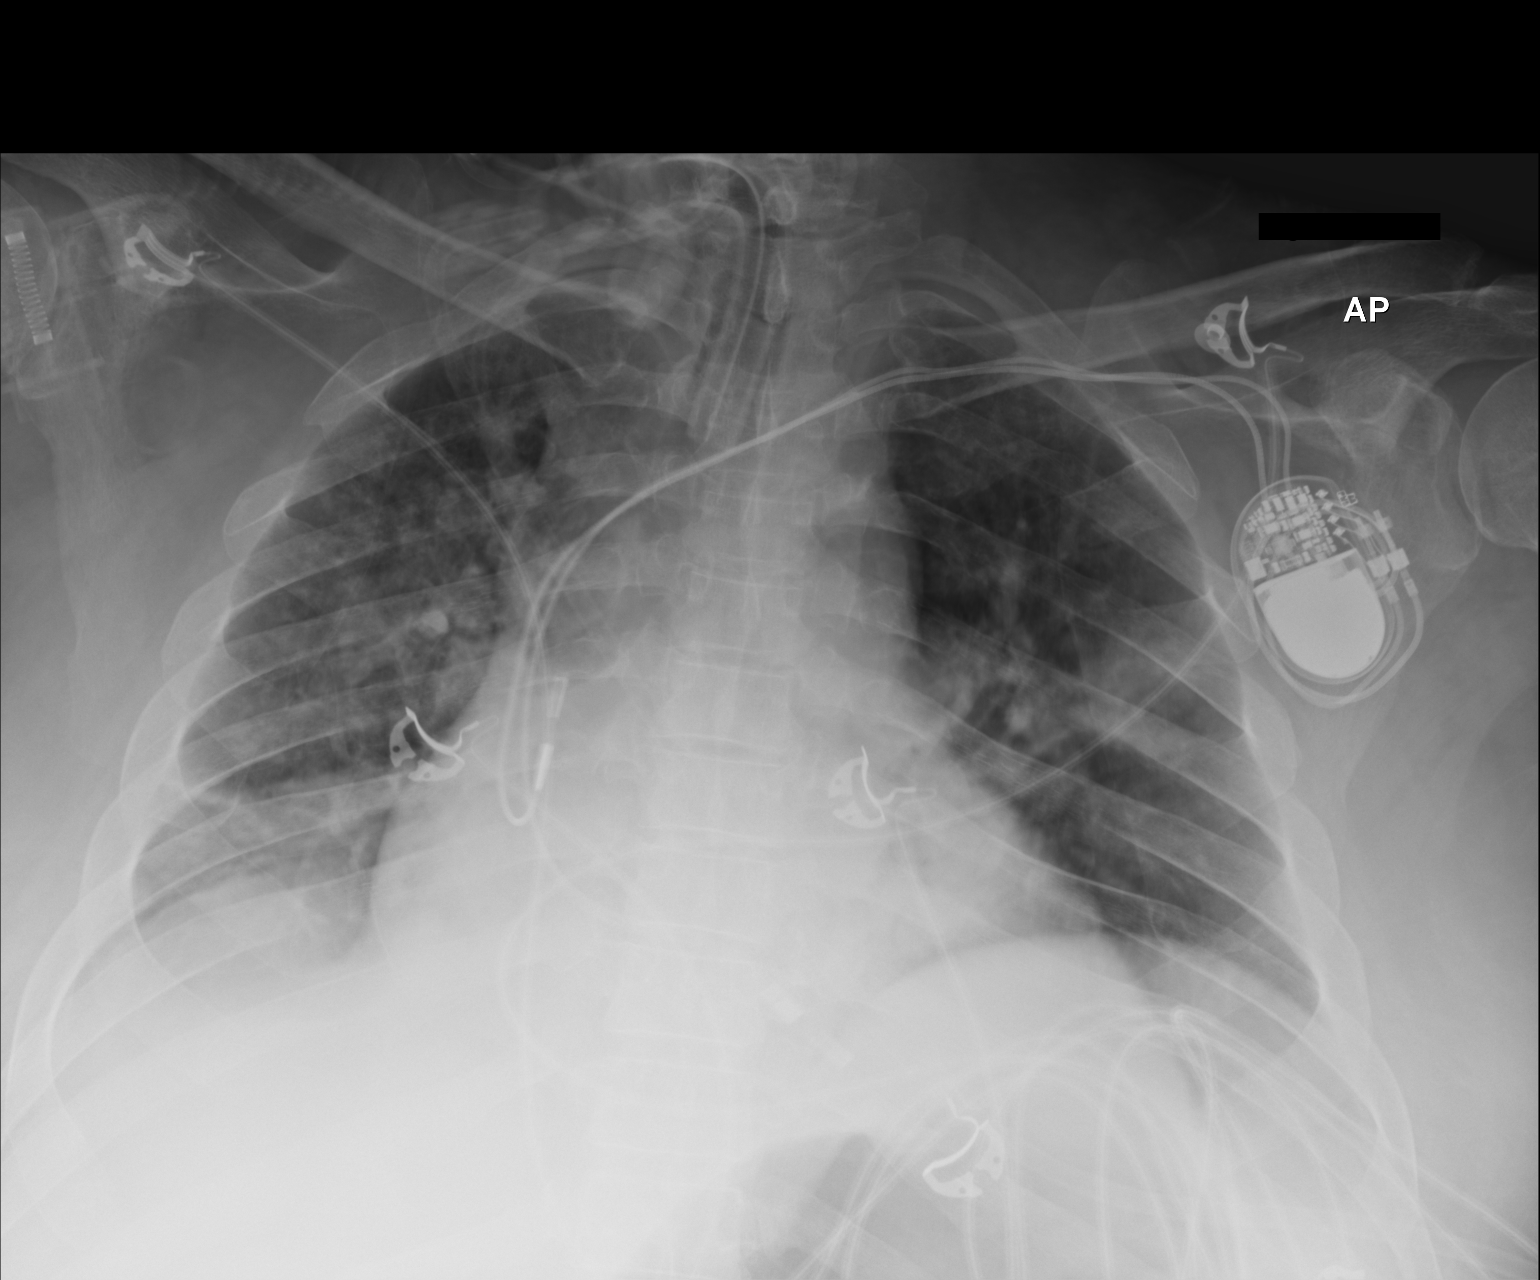

[1 of 1 positions shown; findings below may reference images not displayed]

FINDINGS: Tracheostomy catheter tip is 3.0 cm above the carina. Pacemaker
leads are attached to the right atrium right ventricle. No
pneumothorax. There is patchy infiltrate in the left mid lung, right
mid lung, and right base regions. Lungs elsewhere clear. Heart is
mildly enlarged with pulmonary vascularity within normal limits. No
adenopathy. No bone lesions.
IMPRESSION: Patchy infiltrate in the left mid lung, right mid lung and right
base regions, consistent with multifocal pneumonia. Stable cardiac
prominence. Tracheostomy catheter as described without pneumothorax.

## 2016-11-15 IMAGING — CT CT ABD-PELV W/O CM
2 of 4 series · 16 of 46 positions shown, 18 images · non-contrast
Comparison: None.

CLINICAL DATA: Abdominal distention

EXAM:
CT ABDOMEN AND PELVIS WITHOUT CONTRAST
TECHNIQUE: Multidetector CT imaging of the abdomen and pelvis was performed
following the standard protocol without IV contrast.

[Series 2: abd/ pelvis 5.0 i30f 1 · axial · 0.94mm/px · z∈[-387,+73]mm · 13 of 102 slices shown, 15 images]
[im 5/102  soft-tissue]
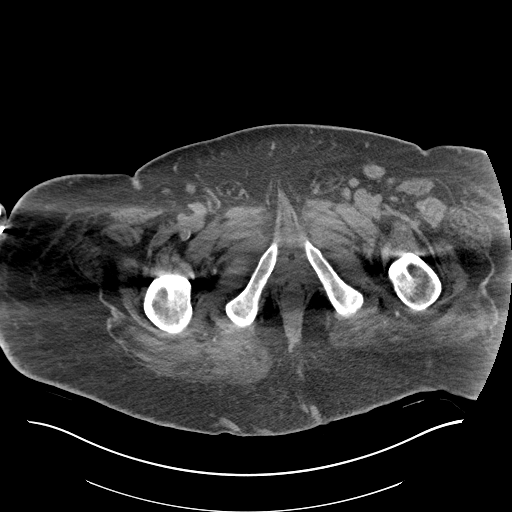
[im 5/102  bone]
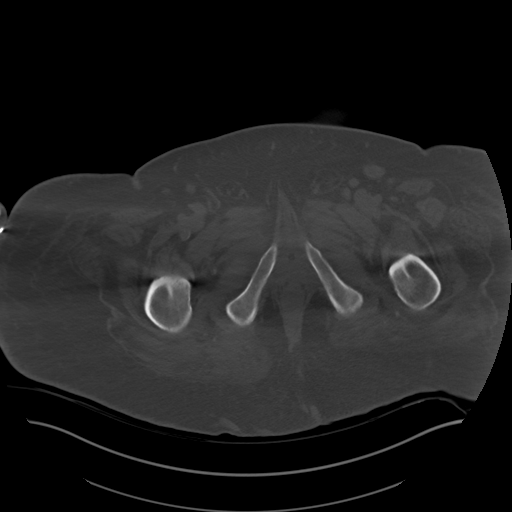
[im 13/102  soft-tissue]
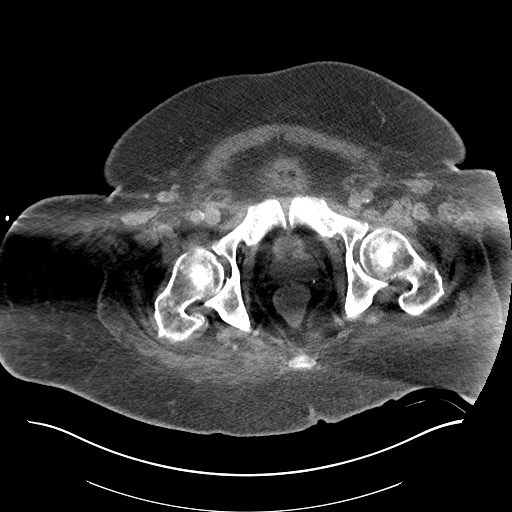
[im 22/102  soft-tissue]
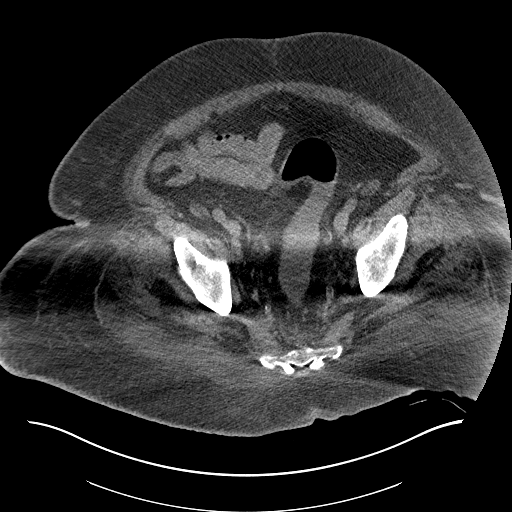
[im 30/102  soft-tissue]
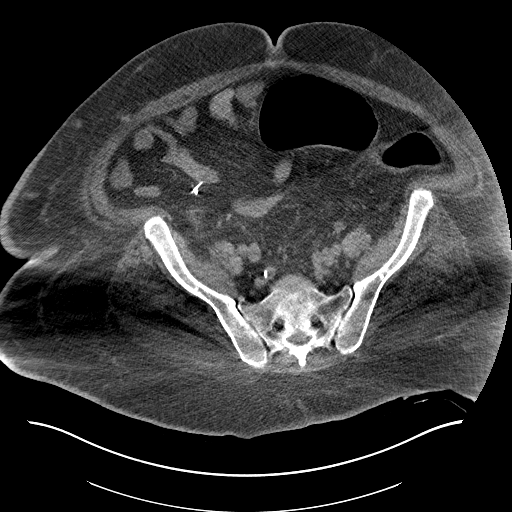
[im 34/102  soft-tissue]
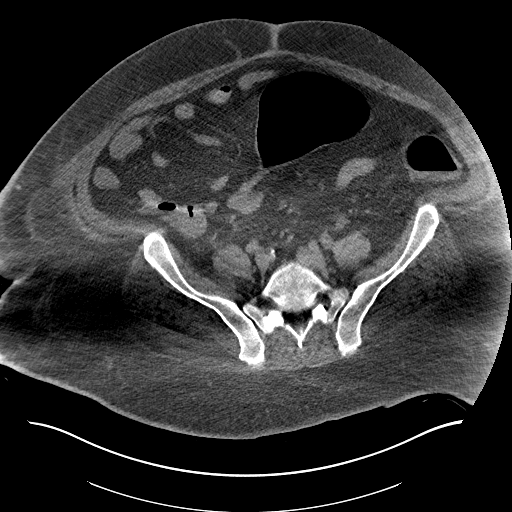
[im 43/102  soft-tissue]
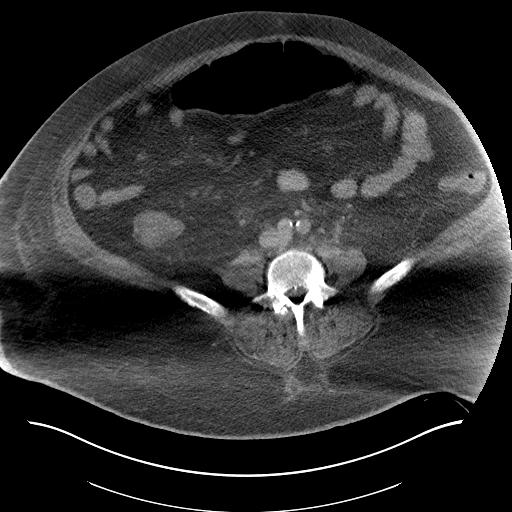
[im 51/102  soft-tissue]
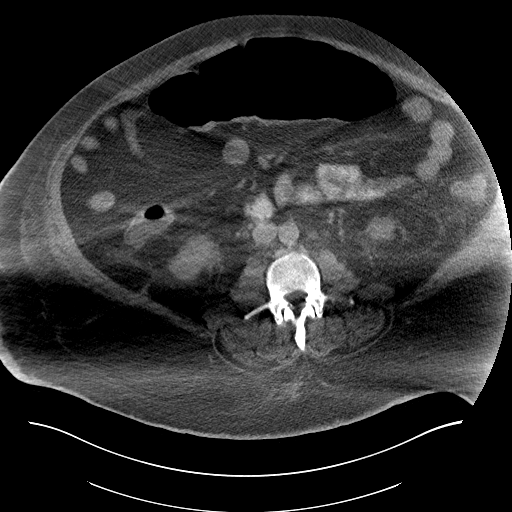
[im 59/102  soft-tissue]
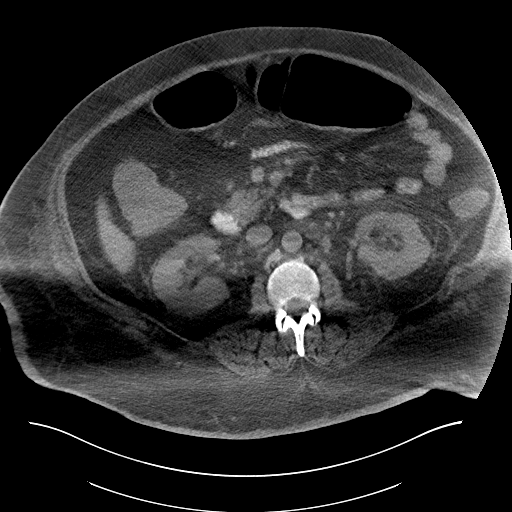
[im 68/102  soft-tissue]
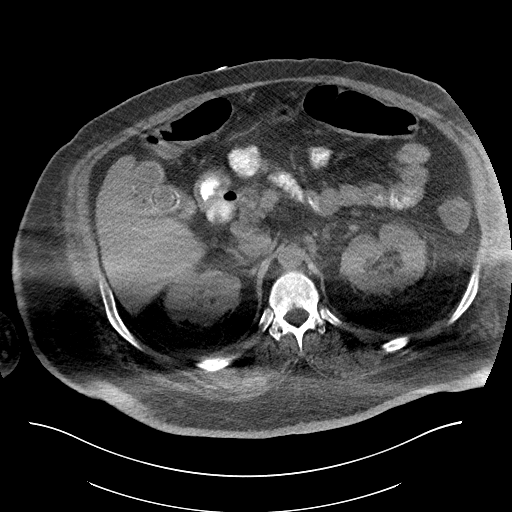
[im 68/102  bone]
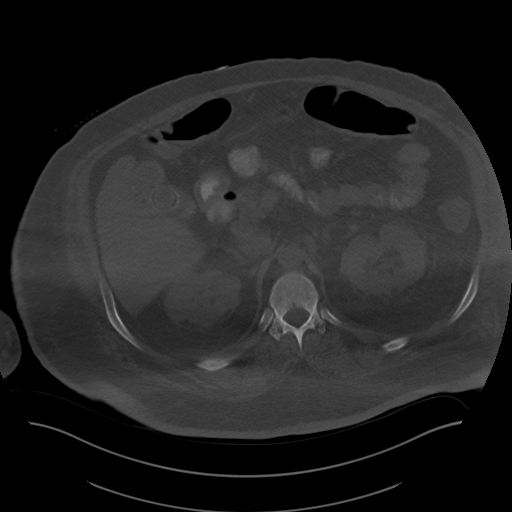
[im 72/102  soft-tissue]
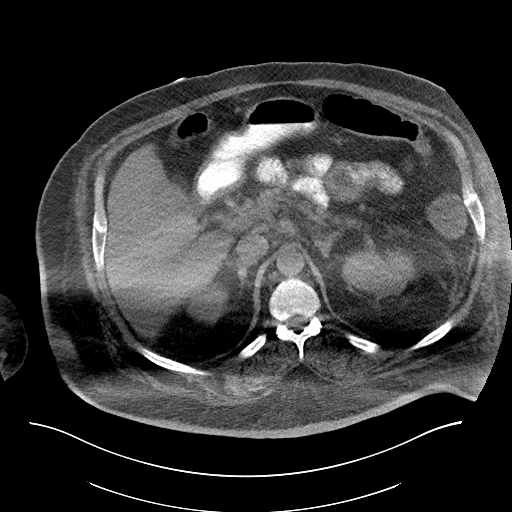
[im 80/102  soft-tissue]
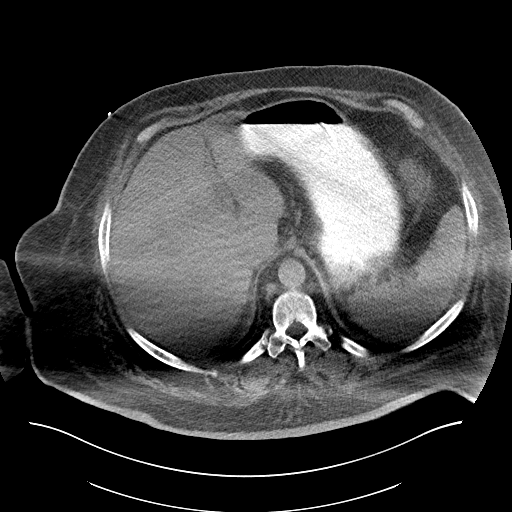
[im 89/102  soft-tissue]
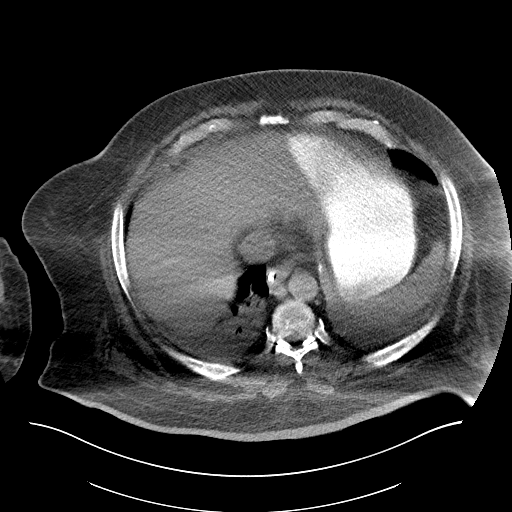
[im 97/102  soft-tissue]
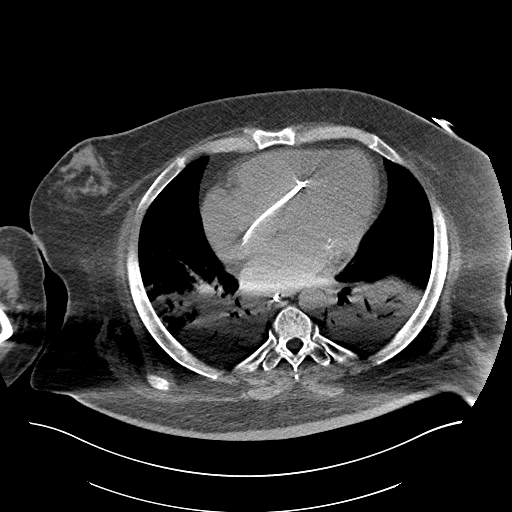

[Series 5: cor st · coronal · 1.00mm/px · 3 of 126 slices shown]
[im 42/126  soft-tissue]
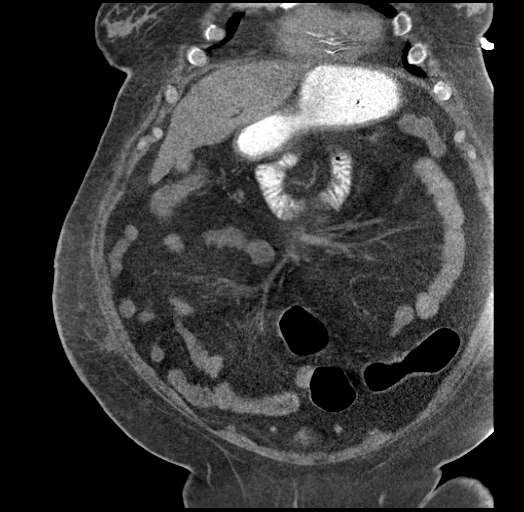
[im 56/126  soft-tissue]
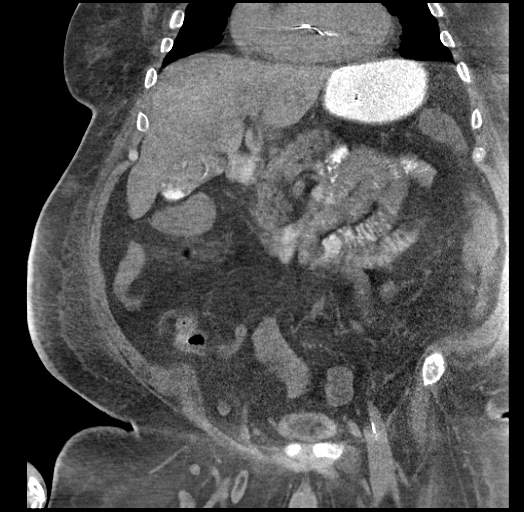
[im 70/126  soft-tissue]
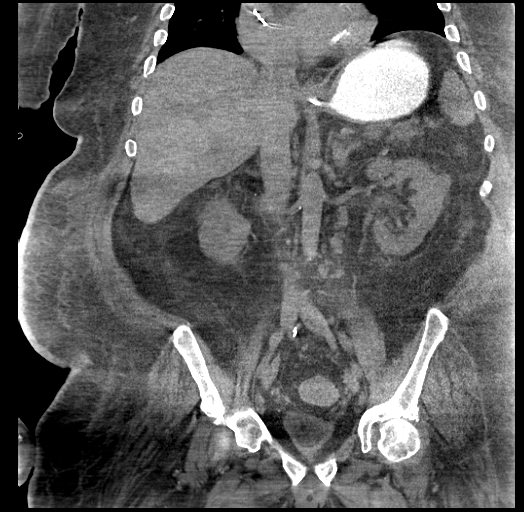

[16 of 46 positions shown; findings below may reference images not displayed]

FINDINGS: Lower chest: Patchy bilateral lower lobe opacities, possibly
compressive atelectasis, bilateral lower lobe pneumonia not
excluded.

Cardiomegaly.  Pacemaker wires, incompletely visualized.

Hepatobiliary: Liver is grossly unremarkable.

Multiple gallstones, measuring up to 2.8 cm (series 2/ image 34). No
associated inflammatory changes.

Pancreas: Mild peripancreatic stranding along the pancreatic tail
(series 2/image 28), possibly reflecting mild acute pancreatitis,
although this is not concordant with laboratory evaluation.

Spleen: Within normal limits.

Adrenals/Urinary Tract: Adrenal glands are within normal limits.

1.6 cm cyst in the lateral left upper kidney (series 2/ image 37).
Right kidney is unremarkable. No renal, ureteral, or bladder
calculi. No hydronephrosis.

Bladder is notable for an indwelling Foley catheter and nondependent
gas.

Stomach/Bowel: Stomach is notable for an enteric tube which
terminates in the distal gastric body.

No evidence of bowel obstruction.

Fluid within the left colon, raising the possibility of a diarrheal
process.

Nondependent gas within the transverse colon.

Vascular/Lymphatic: Atherosclerotic calcifications of the abdominal
aorta and branch vessels. No evidence of abdominal aortic aneurysm.

Small upper abdominal/ retroperitoneal lymph nodes, including a 13
mm short axis portacaval node (series 2/ image 34) and a 9 mm short
axis left para-aortic node (series 2/ image 46), likely reactive.

Reproductive: Prostate is unremarkable.

Other: No abdominopelvic ascites.

Musculoskeletal: Visualized osseous structures are within normal
limits.
IMPRESSION: No evidence of bowel obstruction. Fluid within the left colon,
raising the possibility of a diarrheal process.

Cholelithiasis, without associated findings to suggest acute
cholecystitis.

Mild peripancreatic stranding along the pancreatic tail, possibly
reflecting mild acute pancreatitis, although this is not concordant
with laboratory evaluation.

Patchy bilateral lower lobe opacities, possibly compressive
atelectasis, bilateral lower lobe pneumonia not excluded.

Additional ancillary findings as above.

## 2016-11-15 IMAGING — CR DG CHEST 1V PORT
1 series · 1 of 1 positions shown · non-contrast
Comparison: Yesterday at 5656 hour

CLINICAL DATA: Central line placement.

EXAM:
PORTABLE CHEST 1 VIEW

[AP]
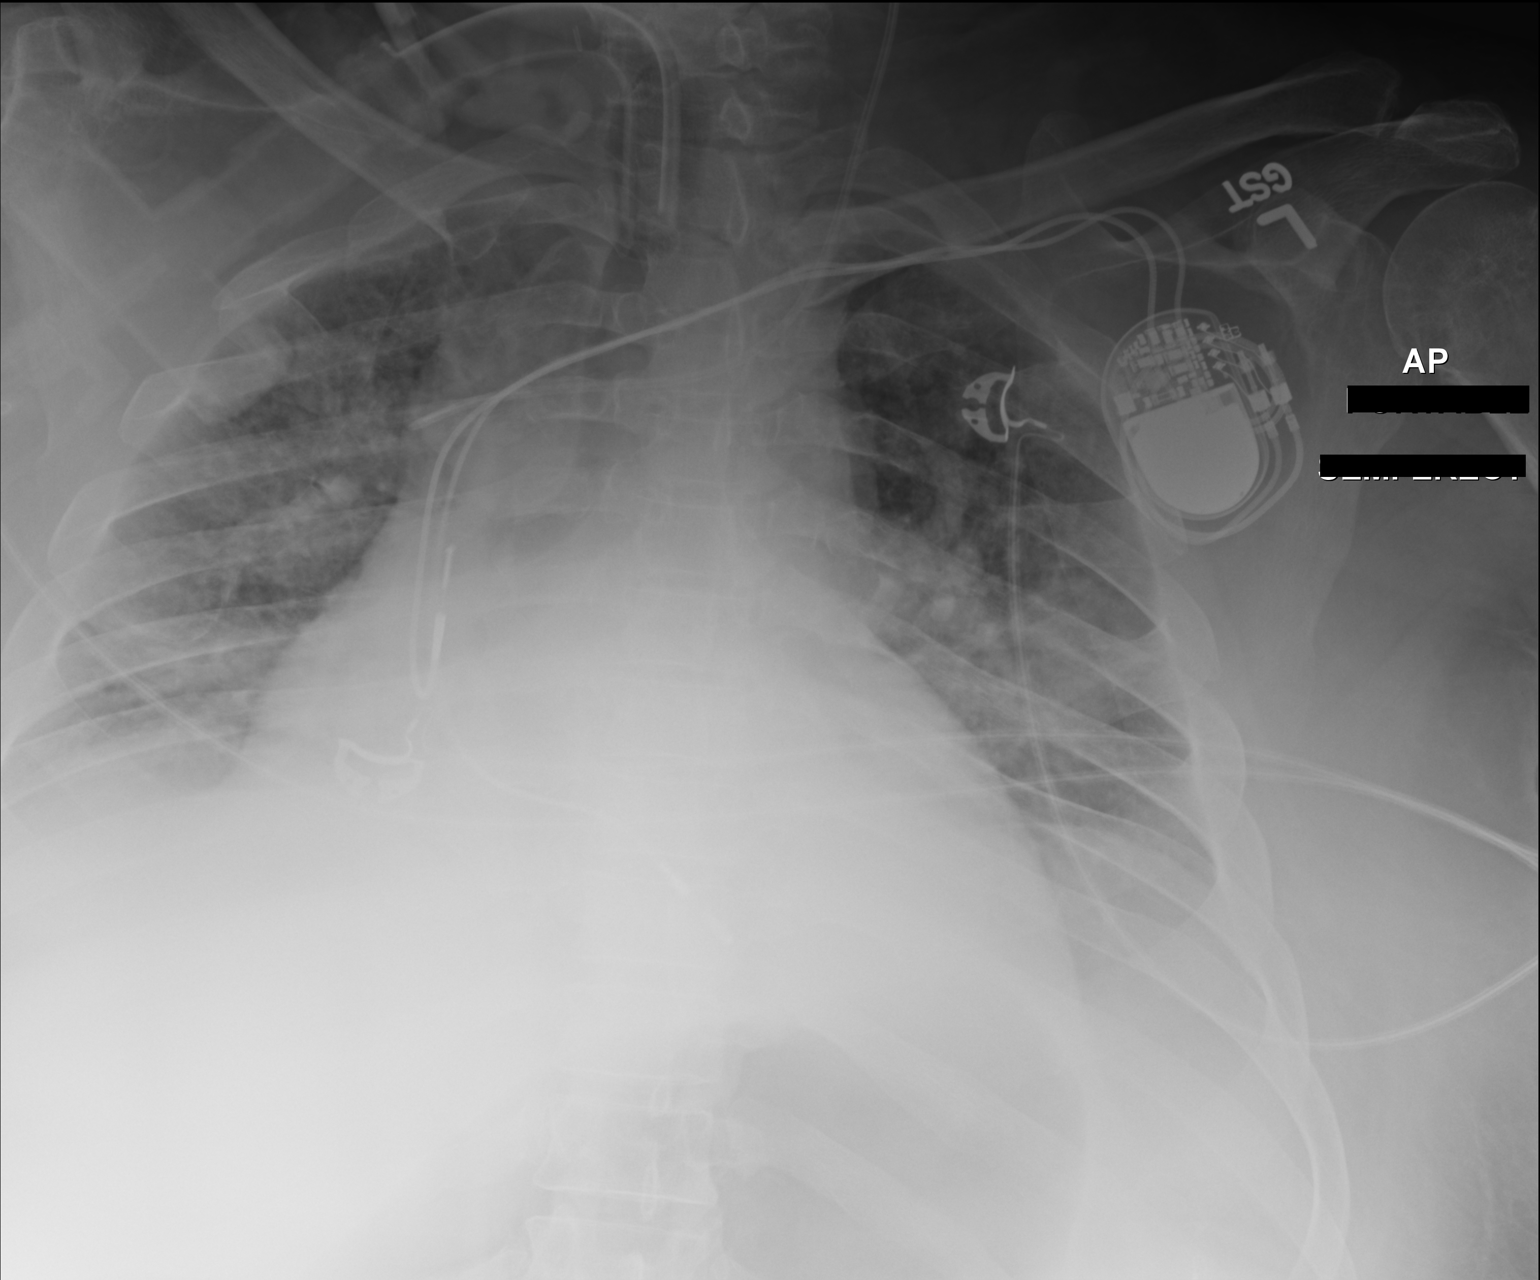

[1 of 1 positions shown; findings below may reference images not displayed]

FINDINGS: New left internal jugular central venous catheter tip in the region
of a proximal SVC. Tracheostomy tube remains the thoracic inlet.
Left-sided pacemaker in place. No evidence of pneumothorax.

Multi focal bilateral patchy opacities of mildly progressed in the
interim. Question of developing pleural effusions with hazy opacity
at the lung bases. Cardiomegaly is stable.
IMPRESSION: 1. Tip of the left central line in the proximal SVC. No evidence of
pneumothorax.
2. Worsening lung aeration with multifocal bilateral patchy
opacities. Developing hazy opacity at the lung bases, likely pleural
effusions. Multifocal pneumonia versus CHF.

## 2016-11-16 IMAGING — CR DG CHEST 1V PORT
1 series · 1 of 1 positions shown · non-contrast
Comparison: 05/12/2015 and 05/11/2015 and 05/02/2015

CLINICAL DATA: Pneumonia.

EXAM:
PORTABLE CHEST 1 VIEW

[AP]
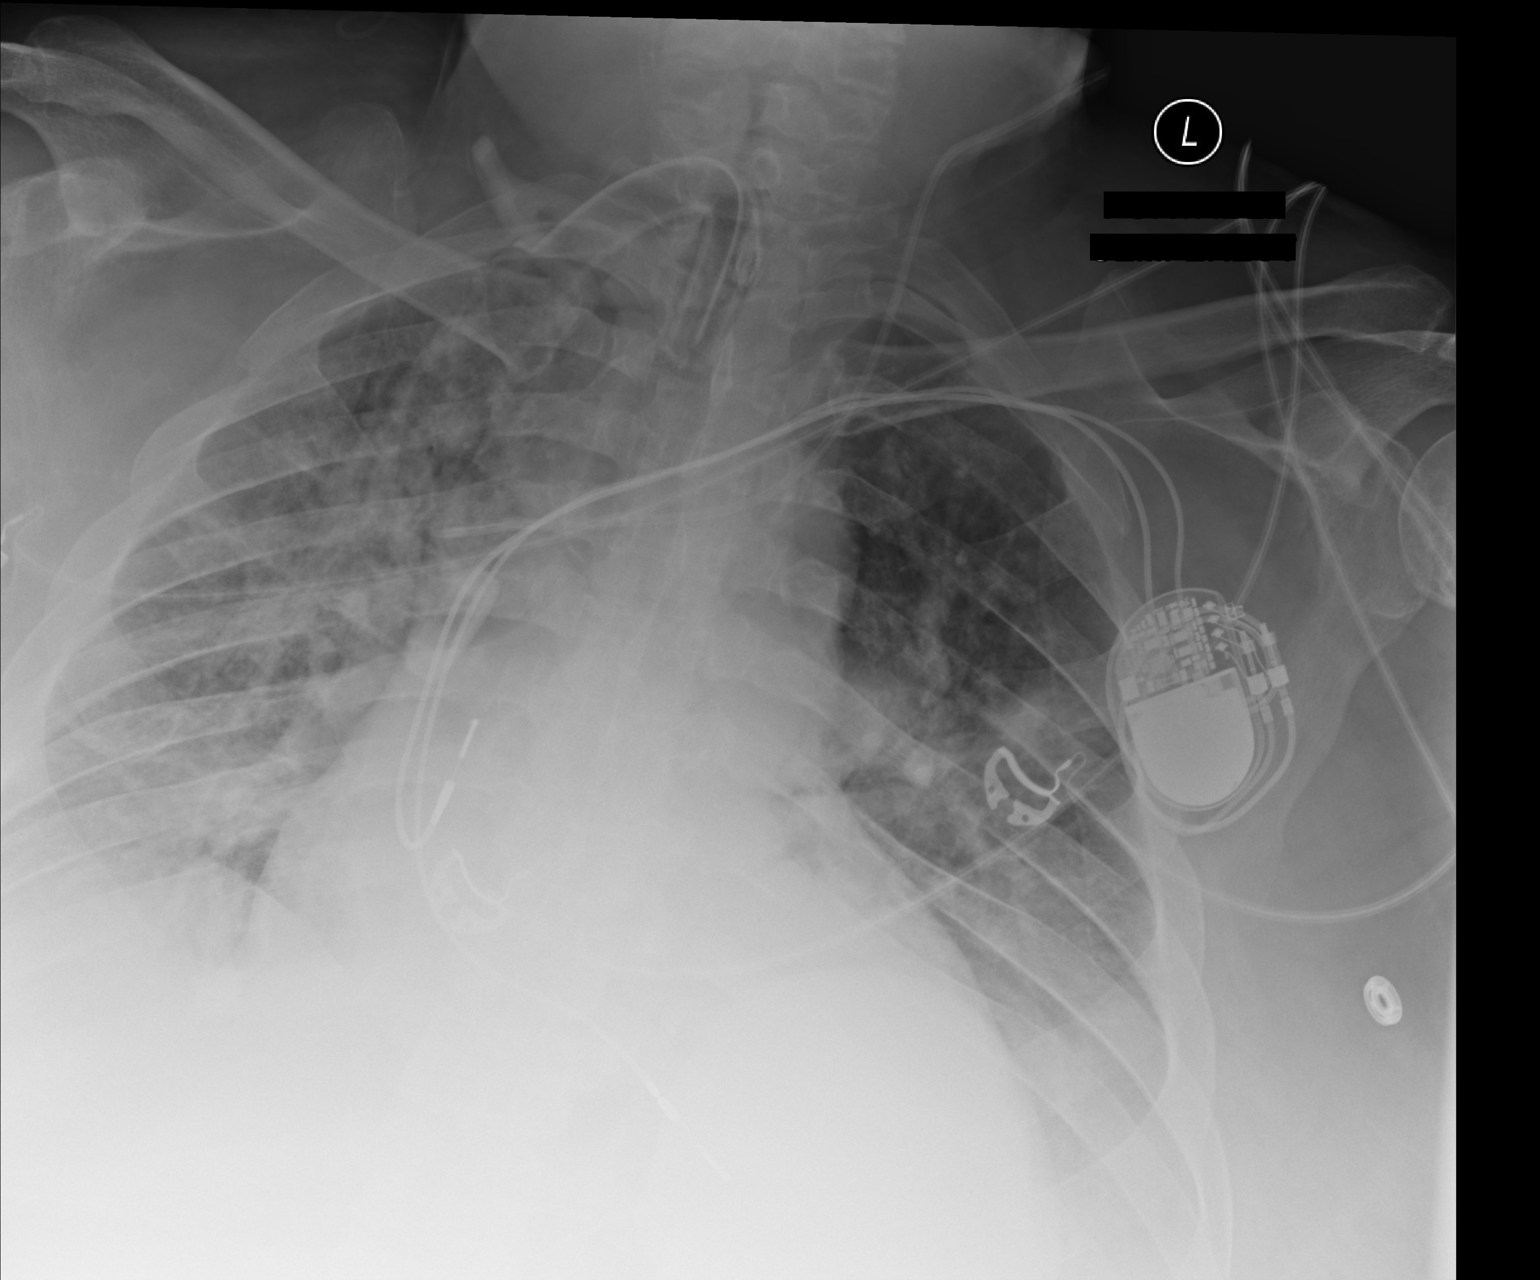

[1 of 1 positions shown; findings below may reference images not displayed]

FINDINGS: Tracheostomy tube, central venous catheter, and pacemaker appear
unchanged.

The hazy infiltrate in the right lung has diffusely slightly
increased. Infiltrate in the left perihilar region has slightly
improved.

Chronic cardiomegaly. Pulmonary vascularity is within normal limits.
Possible small left pleural effusion.
IMPRESSION: 1. Progression of diffuse hazy infiltrate on the right.
2. Slight improvement left perihilar infiltrate.
3. Possible small left effusion.

## 2016-11-19 IMAGING — CR DG CHEST 1V PORT
1 series · 1 of 1 positions shown · non-contrast
Comparison: Portable chest x-ray May 15, 2015

CLINICAL DATA: Healthcare associated pneumonia, shortness of
breath, sepsis, acute renal injury, acute on chronic respiratory
failure tracheostomy patient

EXAM:
PORTABLE CHEST 1 VIEW

[AP]
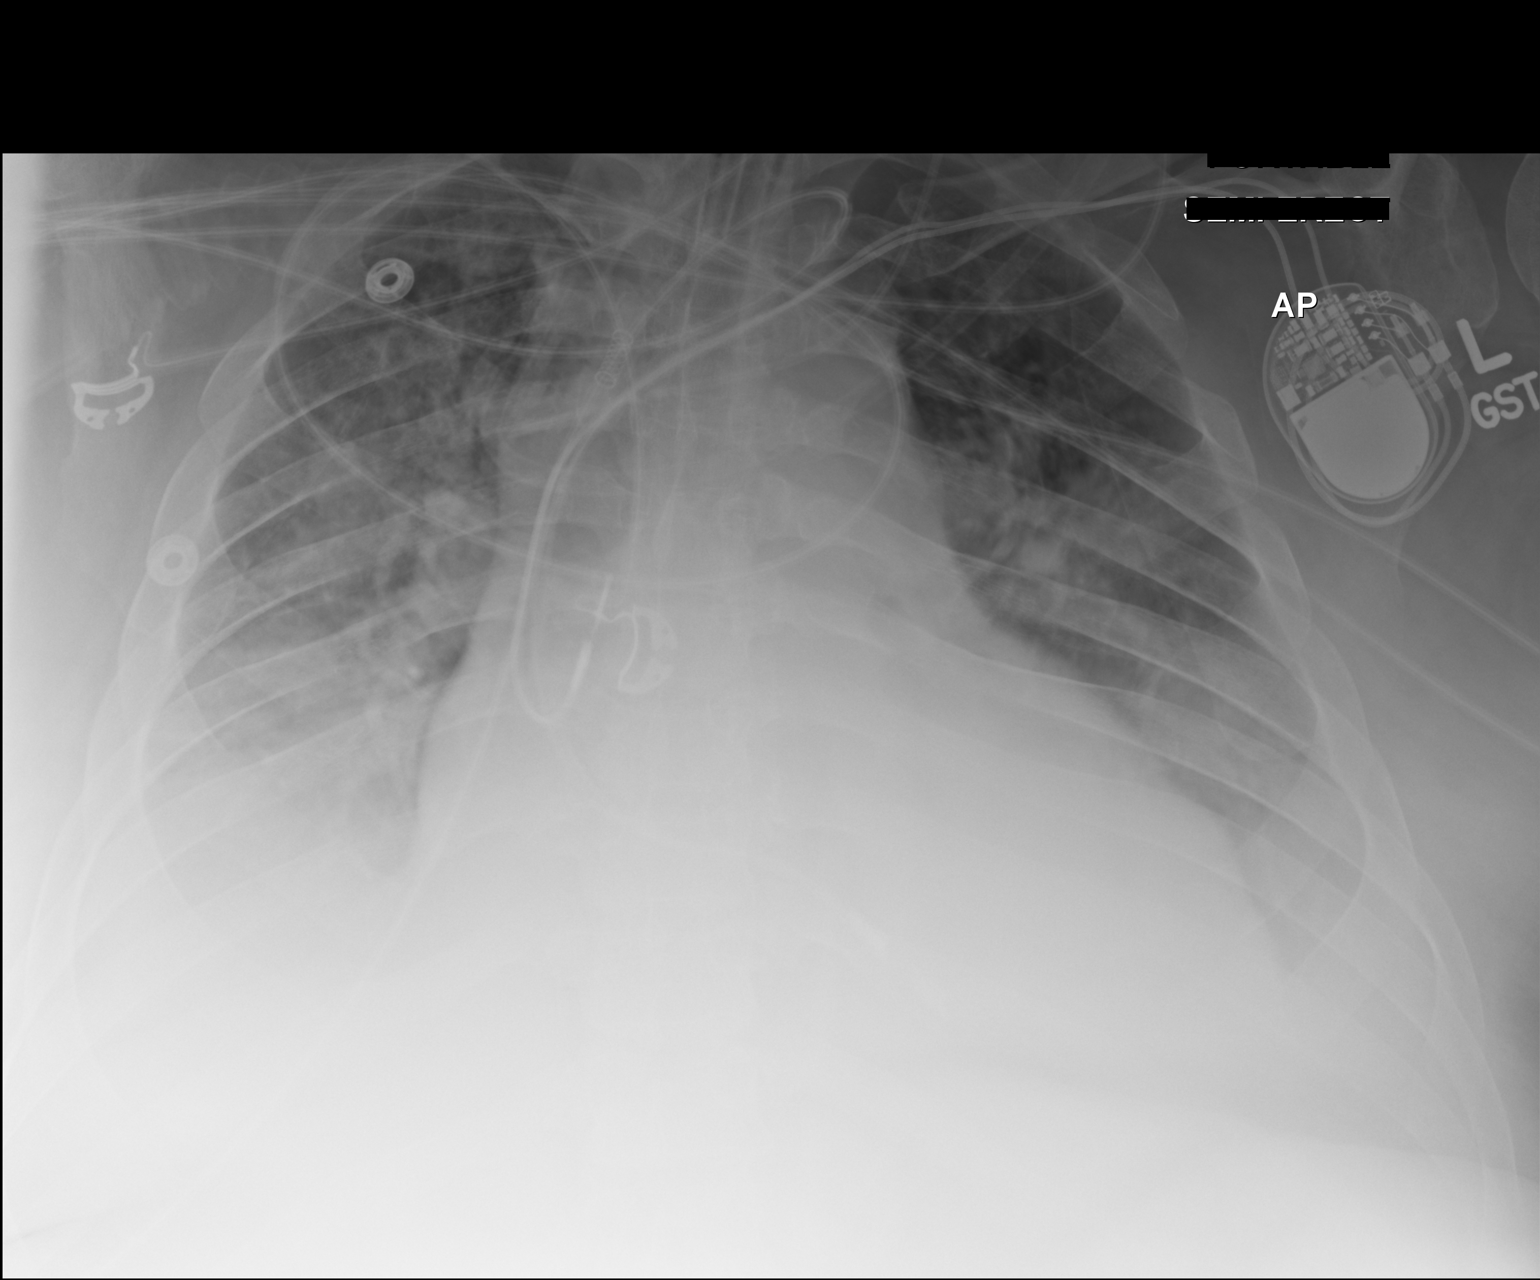

[1 of 1 positions shown; findings below may reference images not displayed]

FINDINGS: The lungs are reasonably well inflated. Bilateral pleural effusions
obscure the hemidiaphragms. The retrocardiac region remains dense.
The pulmonary vascularity is engorged and more conspicuous today.
The tracheostomy appliance tip lies at the level of the clavicular
heads. The permanent pacemaker is in stable position. The left
internal jugular venous catheter tip projects over the proximal SVC.
IMPRESSION: Increased pulmonary edema and increased pleural effusions since
yesterday's study consistent with CHF. No definite pneumonia is
observed but the retrocardiac region on the left is quite dense and
could certainly obscure an infiltrate.
